# Patient Record
Sex: Male | Born: 1940 | Race: White | Hispanic: No | Smoking: Former smoker
Health system: Southern US, Community
[De-identification: ages and names within clinical notes are randomized; demographics above are authoritative.]

## PROBLEM LIST (undated history)

## (undated) DIAGNOSIS — I629 Nontraumatic intracranial hemorrhage, unspecified: Secondary | ICD-10-CM

## (undated) DIAGNOSIS — I639 Cerebral infarction, unspecified: Secondary | ICD-10-CM

## (undated) DIAGNOSIS — I509 Heart failure, unspecified: Secondary | ICD-10-CM

## (undated) DIAGNOSIS — Z86718 Personal history of other venous thrombosis and embolism: Secondary | ICD-10-CM

## (undated) DIAGNOSIS — I2699 Other pulmonary embolism without acute cor pulmonale: Secondary | ICD-10-CM

## (undated) HISTORY — PX: BACK SURGERY: SHX140

## (undated) HISTORY — PX: OTHER SURGICAL HISTORY: SHX169

## (undated) HISTORY — PX: KNEE SURGERY: SHX244

## (undated) HISTORY — PX: LOBECTOMY: SHX5089

---

## 2000-04-26 ENCOUNTER — Encounter: Payer: Self-pay | Admitting: Emergency Medicine

## 2000-04-26 ENCOUNTER — Emergency Department (HOSPITAL_COMMUNITY): Admission: EM | Admit: 2000-04-26 | Discharge: 2000-04-26 | Payer: Self-pay | Admitting: Emergency Medicine

## 2000-07-17 ENCOUNTER — Ambulatory Visit (HOSPITAL_COMMUNITY): Admission: RE | Admit: 2000-07-17 | Discharge: 2000-07-17 | Payer: Self-pay | Admitting: Family Medicine

## 2000-08-04 ENCOUNTER — Ambulatory Visit (HOSPITAL_COMMUNITY): Admission: RE | Admit: 2000-08-04 | Discharge: 2000-08-04 | Payer: Self-pay | Admitting: Family Medicine

## 2002-10-04 ENCOUNTER — Inpatient Hospital Stay (HOSPITAL_COMMUNITY): Admission: AD | Admit: 2002-10-04 | Discharge: 2002-10-23 | Payer: Self-pay | Admitting: Cardiology

## 2002-10-04 ENCOUNTER — Encounter: Payer: Self-pay | Admitting: Cardiology

## 2002-10-10 ENCOUNTER — Encounter: Payer: Self-pay | Admitting: Cardiology

## 2002-10-11 ENCOUNTER — Encounter: Payer: Self-pay | Admitting: Cardiology

## 2002-10-12 ENCOUNTER — Encounter: Payer: Self-pay | Admitting: Cardiology

## 2002-10-13 ENCOUNTER — Encounter: Payer: Self-pay | Admitting: Cardiology

## 2002-10-15 ENCOUNTER — Encounter: Payer: Self-pay | Admitting: Cardiology

## 2002-10-17 ENCOUNTER — Encounter: Payer: Self-pay | Admitting: Cardiology

## 2006-07-26 ENCOUNTER — Inpatient Hospital Stay (HOSPITAL_COMMUNITY): Admission: EM | Admit: 2006-07-26 | Discharge: 2006-07-29 | Payer: Self-pay | Admitting: Emergency Medicine

## 2013-11-17 ENCOUNTER — Ambulatory Visit
Admission: RE | Admit: 2013-11-17 | Discharge: 2013-11-17 | Disposition: A | Discharge status: Home / Self Care | Payer: Medicare Other | Source: Ambulatory Visit | Attending: Family Medicine | Admitting: Family Medicine

## 2013-11-17 ENCOUNTER — Other Ambulatory Visit: Payer: Self-pay | Admitting: Family Medicine

## 2013-11-17 DIAGNOSIS — M25561 Pain in right knee: Secondary | ICD-10-CM

## 2013-11-17 DIAGNOSIS — R609 Edema, unspecified: Secondary | ICD-10-CM

## 2013-11-23 ENCOUNTER — Emergency Department (HOSPITAL_COMMUNITY): Payer: Medicare Other

## 2013-11-23 ENCOUNTER — Other Ambulatory Visit: Payer: Self-pay | Admitting: Family Medicine

## 2013-11-23 ENCOUNTER — Inpatient Hospital Stay (HOSPITAL_COMMUNITY)
Admission: EM | Admit: 2013-11-23 | Discharge: 2013-11-30 | DRG: 175 | Disposition: A | Discharge status: Skilled Nursing Facility | Payer: Medicare Other | Attending: Internal Medicine | Admitting: Internal Medicine

## 2013-11-23 ENCOUNTER — Ambulatory Visit
Admission: RE | Admit: 2013-11-23 | Discharge: 2013-11-23 | Disposition: A | Discharge status: Home / Self Care | Payer: Medicare Other | Source: Ambulatory Visit | Attending: Family Medicine | Admitting: Family Medicine

## 2013-11-23 ENCOUNTER — Encounter (HOSPITAL_COMMUNITY): Payer: Self-pay | Admitting: Emergency Medicine

## 2013-11-23 DIAGNOSIS — I428 Other cardiomyopathies: Secondary | ICD-10-CM | POA: Diagnosis present

## 2013-11-23 DIAGNOSIS — N183 Chronic kidney disease, stage 3 unspecified: Secondary | ICD-10-CM | POA: Diagnosis present

## 2013-11-23 DIAGNOSIS — R609 Edema, unspecified: Secondary | ICD-10-CM

## 2013-11-23 DIAGNOSIS — R0602 Shortness of breath: Secondary | ICD-10-CM

## 2013-11-23 DIAGNOSIS — I4729 Other ventricular tachycardia: Secondary | ICD-10-CM | POA: Diagnosis not present

## 2013-11-23 DIAGNOSIS — Z87891 Personal history of nicotine dependence: Secondary | ICD-10-CM

## 2013-11-23 DIAGNOSIS — I472 Ventricular tachycardia, unspecified: Secondary | ICD-10-CM | POA: Diagnosis not present

## 2013-11-23 DIAGNOSIS — Z9181 History of falling: Secondary | ICD-10-CM

## 2013-11-23 DIAGNOSIS — E875 Hyperkalemia: Secondary | ICD-10-CM | POA: Diagnosis present

## 2013-11-23 DIAGNOSIS — R079 Chest pain, unspecified: Secondary | ICD-10-CM

## 2013-11-23 DIAGNOSIS — I2789 Other specified pulmonary heart diseases: Secondary | ICD-10-CM

## 2013-11-23 DIAGNOSIS — I4949 Other premature depolarization: Secondary | ICD-10-CM | POA: Diagnosis not present

## 2013-11-23 DIAGNOSIS — I5022 Chronic systolic (congestive) heart failure: Secondary | ICD-10-CM

## 2013-11-23 DIAGNOSIS — Z823 Family history of stroke: Secondary | ICD-10-CM

## 2013-11-23 DIAGNOSIS — I5042 Chronic combined systolic (congestive) and diastolic (congestive) heart failure: Secondary | ICD-10-CM | POA: Diagnosis present

## 2013-11-23 DIAGNOSIS — D7589 Other specified diseases of blood and blood-forming organs: Secondary | ICD-10-CM | POA: Diagnosis present

## 2013-11-23 DIAGNOSIS — E43 Unspecified severe protein-calorie malnutrition: Secondary | ICD-10-CM | POA: Diagnosis present

## 2013-11-23 DIAGNOSIS — Z515 Encounter for palliative care: Secondary | ICD-10-CM

## 2013-11-23 DIAGNOSIS — E162 Hypoglycemia, unspecified: Secondary | ICD-10-CM | POA: Diagnosis present

## 2013-11-23 DIAGNOSIS — I272 Pulmonary hypertension, unspecified: Secondary | ICD-10-CM | POA: Diagnosis present

## 2013-11-23 DIAGNOSIS — I959 Hypotension, unspecified: Secondary | ICD-10-CM | POA: Diagnosis present

## 2013-11-23 DIAGNOSIS — I2699 Other pulmonary embolism without acute cor pulmonale: Principal | ICD-10-CM | POA: Diagnosis present

## 2013-11-23 DIAGNOSIS — Z820 Family history of epilepsy and other diseases of the nervous system: Secondary | ICD-10-CM

## 2013-11-23 DIAGNOSIS — R05 Cough: Secondary | ICD-10-CM | POA: Diagnosis present

## 2013-11-23 DIAGNOSIS — K5900 Constipation, unspecified: Secondary | ICD-10-CM | POA: Diagnosis not present

## 2013-11-23 DIAGNOSIS — K7689 Other specified diseases of liver: Secondary | ICD-10-CM | POA: Diagnosis present

## 2013-11-23 DIAGNOSIS — Z86711 Personal history of pulmonary embolism: Secondary | ICD-10-CM

## 2013-11-23 DIAGNOSIS — R5381 Other malaise: Secondary | ICD-10-CM

## 2013-11-23 DIAGNOSIS — I129 Hypertensive chronic kidney disease with stage 1 through stage 4 chronic kidney disease, or unspecified chronic kidney disease: Secondary | ICD-10-CM | POA: Diagnosis present

## 2013-11-23 DIAGNOSIS — Z79899 Other long term (current) drug therapy: Secondary | ICD-10-CM

## 2013-11-23 DIAGNOSIS — R791 Abnormal coagulation profile: Secondary | ICD-10-CM | POA: Diagnosis present

## 2013-11-23 DIAGNOSIS — Z7982 Long term (current) use of aspirin: Secondary | ICD-10-CM

## 2013-11-23 DIAGNOSIS — I69998 Other sequelae following unspecified cerebrovascular disease: Secondary | ICD-10-CM

## 2013-11-23 DIAGNOSIS — D696 Thrombocytopenia, unspecified: Secondary | ICD-10-CM | POA: Diagnosis present

## 2013-11-23 DIAGNOSIS — K649 Unspecified hemorrhoids: Secondary | ICD-10-CM | POA: Diagnosis present

## 2013-11-23 DIAGNOSIS — I509 Heart failure, unspecified: Secondary | ICD-10-CM | POA: Diagnosis present

## 2013-11-23 DIAGNOSIS — R059 Cough, unspecified: Secondary | ICD-10-CM | POA: Diagnosis present

## 2013-11-23 DIAGNOSIS — R7989 Other specified abnormal findings of blood chemistry: Secondary | ICD-10-CM | POA: Diagnosis present

## 2013-11-23 DIAGNOSIS — I69959 Hemiplegia and hemiparesis following unspecified cerebrovascular disease affecting unspecified side: Secondary | ICD-10-CM

## 2013-11-23 HISTORY — DX: Other pulmonary embolism without acute cor pulmonale: I26.99

## 2013-11-23 HISTORY — DX: Heart failure, unspecified: I50.9

## 2013-11-23 HISTORY — DX: Nontraumatic intracranial hemorrhage, unspecified: I62.9

## 2013-11-23 LAB — COMPREHENSIVE METABOLIC PANEL
ALT: 69 U/L — ABNORMAL HIGH (ref 0–53)
AST: 66 U/L — ABNORMAL HIGH (ref 0–37)
Albumin: 3.4 g/dL — ABNORMAL LOW (ref 3.5–5.2)
CO2: 27 mEq/L (ref 19–32)
Chloride: 99 mEq/L (ref 96–112)
Glucose, Bld: 84 mg/dL (ref 70–99)
Potassium: 3.9 mEq/L (ref 3.5–5.1)
Total Protein: 5.5 g/dL — ABNORMAL LOW (ref 6.0–8.3)

## 2013-11-23 LAB — BLOOD GAS, ARTERIAL
Acid-Base Excess: 1.1 mmol/L (ref 0.0–2.0)
Drawn by: 252031
O2 Content: 2 L/min
TCO2: 24.8 mmol/L (ref 0–100)
pCO2 arterial: 30.1 mmHg — ABNORMAL LOW (ref 35.0–45.0)
pO2, Arterial: 132 mmHg — ABNORMAL HIGH (ref 80.0–100.0)

## 2013-11-23 LAB — CBC WITH DIFFERENTIAL/PLATELET
Basophils Absolute: 0 10*3/uL (ref 0.0–0.1)
Basophils Relative: 0 % (ref 0–1)
Eosinophils Relative: 0 % (ref 0–5)
HCT: 38.8 % — ABNORMAL LOW (ref 39.0–52.0)
MCH: 34.8 pg — ABNORMAL HIGH (ref 26.0–34.0)
MCV: 103.7 fL — ABNORMAL HIGH (ref 78.0–100.0)
Monocytes Absolute: 0.4 10*3/uL (ref 0.1–1.0)
Monocytes Relative: 9 % (ref 3–12)
Neutrophils Relative %: 63 % (ref 43–77)
Platelets: 99 10*3/uL — ABNORMAL LOW (ref 150–400)
RDW: 16.7 % — ABNORMAL HIGH (ref 11.5–15.5)
WBC: 4.8 10*3/uL (ref 4.0–10.5)

## 2013-11-23 LAB — TROPONIN I: Troponin I: <0.3 ng/mL (ref <0.30)

## 2013-11-23 LAB — MRSA PCR SCREENING: MRSA by PCR: NEGATIVE

## 2013-11-23 LAB — APTT: aPTT: 35 seconds (ref 24–37)

## 2013-11-23 LAB — MAGNESIUM: Magnesium: 2.1 mg/dL (ref 1.5–2.5)

## 2013-11-23 LAB — PROTIME-INR
INR: 1.58 — ABNORMAL HIGH (ref 0.00–1.49)
Prothrombin Time: 18.4 seconds — ABNORMAL HIGH (ref 11.6–15.2)

## 2013-11-23 LAB — PRO B NATRIURETIC PEPTIDE: Pro B Natriuretic peptide (BNP): 37244 pg/mL — ABNORMAL HIGH (ref 0–125)

## 2013-11-23 MED ORDER — DOCUSATE SODIUM 100 MG PO CAPS
100.0000 mg | ORAL_CAPSULE | Freq: Every day | ORAL | Status: DC
  Administered 2013-11-24 – 2013-11-26 (×3): 100 mg via ORAL
  Filled 2013-11-23 (×3): qty 1

## 2013-11-23 MED ORDER — PNEUMOCOCCAL VAC POLYVALENT 25 MCG/0.5ML IJ INJ
0.5000 mL | INJECTION | INTRAMUSCULAR | Status: AC
  Administered 2013-11-24: 0.5 mL via INTRAMUSCULAR
  Filled 2013-11-23: qty 0.5

## 2013-11-23 MED ORDER — IOHEXOL 350 MG/ML SOLN
125.0000 mL | Freq: Once | INTRAVENOUS | Status: AC | PRN
  Administered 2013-11-23: 125 mL via INTRAVENOUS

## 2013-11-23 MED ORDER — SODIUM CHLORIDE 0.9 % IV BOLUS (SEPSIS)
250.0000 mL | Freq: Once | INTRAVENOUS | Status: AC
  Administered 2013-11-23: 250 mL via INTRAVENOUS
  Administered 2013-11-23: 22:00:00 via INTRAVENOUS

## 2013-11-23 MED ORDER — HEPARIN (PORCINE) IN NACL 100-0.45 UNIT/ML-% IJ SOLN
1200.0000 [IU]/h | INTRAMUSCULAR | Status: DC
  Administered 2013-11-23 – 2013-11-24 (×2): 1300 [IU]/h via INTRAVENOUS
  Administered 2013-11-25 – 2013-11-26 (×3): 1200 [IU]/h via INTRAVENOUS
  Filled 2013-11-23 (×10): qty 250

## 2013-11-23 MED ORDER — HEPARIN BOLUS VIA INFUSION
4000.0000 [IU] | Freq: Once | INTRAVENOUS | Status: AC
  Administered 2013-11-23: 4000 [IU] via INTRAVENOUS
  Filled 2013-11-23: qty 4000

## 2013-11-23 MED ORDER — INFLUENZA VAC SPLIT QUAD 0.5 ML IM SUSP
0.5000 mL | INTRAMUSCULAR | Status: AC
  Administered 2013-11-24: 0.5 mL via INTRAMUSCULAR
  Filled 2013-11-23: qty 0.5

## 2013-11-23 MED ORDER — SODIUM CHLORIDE 0.9 % IV SOLN
INTRAVENOUS | Status: DC
  Administered 2013-11-23: 20:00:00 via INTRAVENOUS

## 2013-11-23 MED ORDER — SODIUM CHLORIDE 0.9 % IV BOLUS (SEPSIS)
500.0000 mL | Freq: Once | INTRAVENOUS | Status: AC
  Administered 2013-11-23: 500 mL via INTRAVENOUS
  Administered 2013-11-23: 23:00:00 via INTRAVENOUS

## 2013-11-23 MED ORDER — SODIUM CHLORIDE 0.9 % IV BOLUS (SEPSIS)
250.0000 mL | INTRAVENOUS | Status: AC
  Administered 2013-11-23: 250 mL via INTRAVENOUS

## 2013-11-23 MED ORDER — ALBUTEROL SULFATE HFA 108 (90 BASE) MCG/ACT IN AERS
1.0000 | INHALATION_SPRAY | Freq: Four times a day (QID) | RESPIRATORY_TRACT | Status: DC | PRN
  Filled 2013-11-23: qty 6.7

## 2013-11-23 NOTE — Progress Notes (Signed)
Heparin level reports as >2 though per RN there were two phlebotomists drawing labs and they drew blood from both arms so unclear whether this lab is from site where heparin is running.  Will get stat level now from opposite arm  Vernard Gambles, PharmD, BCPS 11/23/2013 11:16 PM

## 2013-11-23 NOTE — Progress Notes (Signed)
eLink Physician-Brief Progress Note Patient Name: Chase [name] DOB: May 03, 1941 MRN: [id number]  Date of Service  11/23/2013   HPI/Events of Note   Submassive PE Hypotension History of Intracranial hemorrhage  eICU Interventions  Bolus NS 500cc now Re-assess afterwards   Intervention Category Intermediate Interventions: Hypotension - evaluation and management  MCQUAID, DOUGLAS 11/23/2013, 10:31 PM

## 2013-11-23 NOTE — ED Provider Notes (Signed)
CSN: [id number]     Arrival date & time 11/23/13  1442 History   First MD Initiated Contact with Patient 11/23/13 1455     Chief Complaint  Patient presents with  . Shortness of Breath   (Consider location/radiation/quality/duration/timing/severity/associated sxs/prior Treatment) Patient is a 72 y.o. male presenting with shortness of breath. The history is provided by the patient.  Shortness of Breath Severity:  Mild Onset quality:  Gradual Duration:  3 weeks Timing:  Constant Progression:  Worsening Chronicity:  New Context: activity   Relieved by:  Nothing Worsened by:  Nothing tried Ineffective treatments:  None tried Associated symptoms: no abdominal pain, no chest pain, no cough, no fever, no headaches, no neck pain and no vomiting   Risk factors: hx of PE/DVT     Past Medical History  Diagnosis Date  . CHF (congestive heart failure)    Past Surgical History  Procedure Laterality Date  . Lobectomy     History reviewed. No pertinent family history. History  Substance Use Topics  . Smoking status: Never Smoker   . Smokeless tobacco: Not on file  . Alcohol Use: No    Review of Systems  Constitutional: Negative for fever.  HENT: Negative for drooling and rhinorrhea.   Eyes: Negative for pain.  Respiratory: Positive for shortness of breath. Negative for cough.   Cardiovascular: Negative for chest pain and leg swelling.  Gastrointestinal: Negative for nausea, vomiting, abdominal pain and diarrhea.  Genitourinary: Negative for dysuria and hematuria.  Musculoskeletal: Negative for gait problem and neck pain.  Skin: Negative for color change.  Neurological: Negative for numbness and headaches.  Hematological: Negative for adenopathy.  Psychiatric/Behavioral: Negative for behavioral problems.  All other systems reviewed and are negative.    Allergies  Review of patient's allergies indicates no known allergies.  Home Medications  No current outpatient  prescriptions on file. BP 92/57  Pulse 101  Temp(Src) 97.4 F (36.3 C) (Oral)  Resp 20  SpO2 92% Physical Exam  Nursing note and vitals reviewed. Constitutional: He is oriented to person, place, and time. He appears well-developed and well-nourished.  HENT:  Head: Normocephalic and atraumatic.  Right Ear: External ear normal.  Left Ear: External ear normal.  Nose: Nose normal.  Mouth/Throat: Oropharynx is clear and moist. No oropharyngeal exudate.  Eyes: Conjunctivae and EOM are normal. Pupils are equal, round, and reactive to light.  Neck: Normal range of motion. Neck supple.  Cardiovascular: Normal rate, regular rhythm, normal heart sounds and intact distal pulses.  Exam reveals no gallop and no friction rub.   No murmur heard. Pulmonary/Chest: Effort normal and breath sounds normal. No respiratory distress. He has no wheezes.  Abdominal: Soft. Bowel sounds are normal. He exhibits no distension. There is no tenderness. There is no rebound and no guarding.  Musculoskeletal: Normal range of motion. He exhibits edema (Moderate pitting edema in bilateral LE's extending to knees bilaterally. ). He exhibits no tenderness.  Neurological: He is alert and oriented to person, place, and time.  Skin: Skin is warm and dry.  Psychiatric: He has a normal mood and affect. His behavior is normal.    ED Course  Procedures (including critical care time) Labs Review Labs Reviewed  CBC WITH DIFFERENTIAL  COMPREHENSIVE METABOLIC PANEL  TROPONIN I  PRO B NATRIURETIC PEPTIDE  PROTIME-INR   Imaging Review Ct Angio Chest Pe W/cm &/or Wo Cm  11/23/2013   CLINICAL DATA:  History of PE, right-sided chest pain and shortness of breath for 3.5  weeks  EXAM: CT ANGIOGRAPHY CHEST WITH CONTRAST  TECHNIQUE: Multidetector CT imaging of the chest was performed using the standard protocol during bolus administration of intravenous contrast. Multiplanar CT image reconstructions including MIPs were obtained to  evaluate the vascular anatomy.  CONTRAST:  OMNIPAQUE IOHEXOL 350 MG/ML SOLN  COMPARISON:  Chest x-ray 11/17/2013  FINDINGS: Mediastinum: Unremarkable CT appearance of the thyroid gland. No suspicious mediastinal or hilar adenopathy. No soft tissue mediastinal mass. The thoracic esophagus is unremarkable.  Heart/Vascular: Adequate opacification of the pulmonary arteries to the proximal subsegmental level. Large volume of thrombus noted in the right main pulmonary artery extending in to the right lower lobar pulmonary artery and its segmental branches. At least 1 small segmental PE is noted in the inferior lingular artery. The main pulmonary artery is dilated with a transverse diameter of 4.4 cm. There is evidence of right heart strain. The RV/LV ratio is 0.92. Additionally, there is reflux of contrast material into the suprahepatic IVC and right hepatic veins.  Overall, there is cardiomegaly with both the left and right heart enlargement. Atherosclerotic calcifications are noted throughout the coronary arteries. No pericardial effusion.  Lungs/Pleura: Small-moderate bilateral layering pleural effusions with associated subsegmental atelectasis. The lungs are hyperinflated. No focal airspace consolidation or infiltrate. No suspicious pulmonary nodule or mass.  Bones/Soft Tissues: No acute fracture or aggressive appearing lytic or blastic osseous lesion. Multilevel degenerative disc disease.  Upper Abdomen: Incompletely imaged sub cm low-attenuation lesions in the right and left liver are incompletely characterized but low in attenuation and a most consistent with small hepatic cysts. Additionally, there is a incompletely imaged 2.2 cm low-attenuation lesion in the upper pole left kidney which is also statistically likely a simple cyst.  Review of the MIP images confirms the above findings.  IMPRESSION: 1. Large volume pulmonary embolus in the right main pulmonary artery extending into the right lower lobar  pulmonary artery and its segmental branches with an additional small segmental PE in the inferior lingula with evidence of right heart strain (RV/LV ratio = 0.92) consistent with intermediate risk/sub massive PE. No airspace infiltrate or evidence of pulmonary infarction. Given the appearance of slight peripheralization of thrombus within the pulmonary arterial lumens, this may reflect a subacute PE. Recommend clinical correlation with the duration of the patient's underlying symptoms. 2. The main pulmonary artery is markedly enlarged suggesting underlying pulmonary arterial hypertension. 3. Cardiomegaly with both right and left sided enlargement. 4. Atherosclerosis including coronary artery disease. 5. Moderate bilateral layering pleural effusions with associated mild subsegmental atelectasis. 6. Incompletely evaluated low-attenuation lesions in the liver and left kidney are statistically highly likely benign cysts.  Critical Value/emergent results were called by telephone at the time of interpretation on 11/23/2013 at 2:32 PM to [facility] , who verbally acknowledged these results. The patient will be sent from the outpatient imaging center to the [facility] emergency room.  Signed,  Sterling Big, MD  [facility]  [facility]   Electronically Signed   By: Malachy Moan M.D.   On: 11/23/2013 14:50    EKG Interpretation    Date/Time:  Wednesday November 23 2013 15:07:35 EST Ventricular Rate:  87 PR Interval:  207 QRS Duration: 179 QT Interval:  492 QTC Calculation: 592 R Axis:   -102 Text Interpretation:  Sinus rhythm Ventricular tachycardia, unsustained Right bundle branch block Inverted t waves in V1-V3 Confirmed by Aleni Andrus  MD, Mavi Un ([id number]) on 11/23/2013 3:28:29 PM  CRITICAL CARE Performed by: Purvis Sheffield, S Total critical care time: 40 min Critical care time was exclusive of separately billable procedures  and treating other patients. Critical care was necessary to treat or prevent imminent or life-threatening deterioration. Critical care was time spent personally by me on the following activities: development of treatment plan with patient and/or surrogate as well as nursing, discussions with consultants, evaluation of patient's response to treatment, examination of patient, obtaining history from patient or surrogate, ordering and performing treatments and interventions, ordering and review of laboratory studies, ordering and review of radiographic studies, pulse oximetry and re-evaluation of patient's condition.  MDM   1. Pulmonary embolism   2. CHF (congestive heart failure)   3. Pulmonary HTN   4. Pulmonary embolus   5. Elevated serum creatinine    3:18 PM 72 y.o. male w hx of PE, CHF pw sob x 3 weeks. He notes occasional right shoulder pain over the last several days as well. He had an outpatient CTA today which showed a pulmonary embolism, likely subacute. He is mildly tachycardic with a heart rate of 101 and an initial blood pressure of 92/57. He is mentating well and interactive on exam. He does have a history of an intracerebral hematoma in 2003 but notes that he was on Coumadin several years ago for a previous pulmonary embolism. Will get labs, 250 cc IV fluid bolus, and consult pharmacy for heparinization. Will continue to monitor closely.  Critical care was documented in this patient w/ a submassive PE and right heart strain. He required heparin as well as close monitoring d/t his borderline BP's. The case was discussed w/ critical care and the pt will be admitted to a step down bed on the medicine service.     Junius Argyle, MD 11/24/13 [phone]

## 2013-11-23 NOTE — ED Notes (Signed)
Pt here from PCP with multiple PE; pt SOB x 2 weeks; pt noted to appear cyanotic in lips at present and sts some CP

## 2013-11-23 NOTE — ED Notes (Signed)
MD at bedside. 

## 2013-11-23 NOTE — ED Notes (Signed)
MD informed pt having runs of Vtach

## 2013-11-23 NOTE — Consult Note (Signed)
PULMONARY/CCM NOTE  Requesting MD/Service: IMTS Date of admission: 11/26 Date of consult: 11/26 Reason for consultation:  PE  Pt Profile:  45 M with hx of PE 72yrs ago after LE trauma admitted with 3 wks of increasing LE edema and DOE. CTA chest revealed large PE. PCCM asked to see due to mild hypotension     HPI:  As above. He has gotten to the point that he is unsteady on his feet and consequently saw his primary MD who ordered a CTA chest revealing PE. He was sent to the [facility] ED where he was admitted by IMTS. He has had asymptomatic mild hypotension since admission. Reports bilateral LE edema and pain. Denies CP  Past Medical History  Diagnosis Date  . CHF (congestive heart failure)   . Unspecified intracranial hemorrhage   . Pulmonary embolism     history on Coumadin    MEDICATIONS: reviewed  History   Social History  . Marital Status: Divorced    Spouse Name: N/A    Number of Children: N/A  . Years of Education: N/A   Occupational History  . Not on file.   Social History Main Topics  . Smoking status: Never Smoker   . Smokeless tobacco: Not on file  . Alcohol Use: No  . Drug Use: No  . Sexual Activity: Not on file   Other Topics Concern  . Not on file   Social History Narrative  . No narrative on file    History reviewed. No pertinent family history.  ROS - As per HPI. Otherwise N/C  Filed Vitals:   11/23/13 2030 11/23/13 2045 11/23/13 2100 11/23/13 2123  BP: 96/71 82/39 88/54  90/68  Pulse: 30 62 62 61  Temp:      TempSrc:      Resp: 17 15 11 12   Height:      Weight:      SpO2: 100% 100% 100% 100%    EXAM:  Gen: pleasant, NAD HEENT: WNL Neck: + JVD Lungs: clear anteriorly without wheezes Cardiovascular: irregular, no M noted Abdomen: soft, NT, NABS Ext: 3+ BLE edema slightly worse on L than R, early chronic stasis changes Neuro: intact  DATA:  I have reviewed all of today's lab results. Relevant abnormalities are discussed in the A/P  section  EKG: RBBB, ventricular ectopy  CXR: CM, interstitial prominence, minimal blunting of R CP angle  CT chest: CM, large PAs, large PE in R main artery, small B effusions   IMPRESSION:   Submassive pulmonary embolus - recurrent  Not a presently candidate for thrombolytics due to prior CNS bleed Pulmonary HTN Severe BLE edema H/O CHF (congestive heart failure) Mild thrombocytopenia - likely consumptive Macrocytosis  PLAN:  Agree with heparin Agree with Echo and LE Dopplers If significant residual clot in LEs, would have IVC filter placed Will need lifelong anticoagulation    Billy Fischer, MD ; [facility] service Mobile [phone].  After 5:30 PM or weekends, call [phone]
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
## 2013-11-23 NOTE — ED Notes (Signed)
Attempt to call report.

## 2013-11-23 NOTE — ED Notes (Signed)
X-ray at bedside

## 2013-11-23 NOTE — Progress Notes (Signed)
ANTICOAGULATION CONSULT NOTE - Initial Consult  Pharmacy Consult for heparin Indication: pulmonary embolus  No Known Allergies  Patient Measurements:   Heparin Dosing Weight: 80kg  Vital Signs: Temp: 97.4 F (36.3 C) (11/26 1446) Temp src: Oral (11/26 1446) BP: 92/57 mmHg (11/26 1446) Pulse Rate: 101 (11/26 1446)  Labs: No results found for this basename: HGB, HCT, PLT, APTT, LABPROT, INR, HEPARINUNFRC, CREATININE, CKTOTAL, CKMB, TROPONINI,  in the last 72 hours  CrCl is unknown because no creatinine reading has been taken and the patient has no height on file.   Medical History: Past Medical History  Diagnosis Date  . CHF (congestive heart failure)     Medications:   (Not in a hospital admission) Scheduled:    Assessment: 72 yo who was admitted for new PE. He is not on anticoagulant PTA. He said that he had previous PE 4-5 yrs ago? IV heparin has been order for anticoagulant here now   Goal of Therapy:  Heparin level 0.3-0.7 units/ml Monitor platelets by anticoagulation protocol: Yes   Plan:  Heparin bolus 4000 units x1 Heparin drip at 1300 units/hr Check baseline coags Heparin level in 6 hrs Daily level and CBC  Ulyses Southward, PharmD Pager: [phone] 11/23/2013 3:31 PM

## 2013-11-24 ENCOUNTER — Encounter (HOSPITAL_COMMUNITY): Payer: Self-pay | Admitting: Internal Medicine

## 2013-11-24 DIAGNOSIS — D696 Thrombocytopenia, unspecified: Secondary | ICD-10-CM

## 2013-11-24 DIAGNOSIS — I059 Rheumatic mitral valve disease, unspecified: Secondary | ICD-10-CM

## 2013-11-24 DIAGNOSIS — I2699 Other pulmonary embolism without acute cor pulmonale: Secondary | ICD-10-CM

## 2013-11-24 LAB — COMPREHENSIVE METABOLIC PANEL
ALT: 71 U/L — ABNORMAL HIGH (ref 0–53)
AST: 68 U/L — ABNORMAL HIGH (ref 0–37)
Albumin: 3.1 g/dL — ABNORMAL LOW (ref 3.5–5.2)
Calcium: 8.8 mg/dL (ref 8.4–10.5)
Chloride: 103 mEq/L (ref 96–112)
Creatinine, Ser: 1.34 mg/dL (ref 0.50–1.35)
Sodium: 142 mEq/L (ref 135–145)
Total Bilirubin: 1.8 mg/dL — ABNORMAL HIGH (ref 0.3–1.2)
Total Protein: 5 g/dL — ABNORMAL LOW (ref 6.0–8.3)

## 2013-11-24 LAB — HEPARIN LEVEL (UNFRACTIONATED)
Heparin Unfractionated: 0.37 IU/mL (ref 0.30–0.70)
Heparin Unfractionated: 0.51 IU/mL (ref 0.30–0.70)
Heparin Unfractionated: 0.78 IU/mL — ABNORMAL HIGH (ref 0.30–0.70)
Heparin Unfractionated: 0.5 IU/mL (ref 0.30–0.70)

## 2013-11-24 LAB — TROPONIN I: Troponin I: <0.3 ng/mL (ref <0.30)

## 2013-11-24 LAB — CBC
MCH: 34.6 pg — ABNORMAL HIGH (ref 26.0–34.0)
Platelets: 81 10*3/uL — ABNORMAL LOW (ref 150–400)
RBC: 3.56 MIL/uL — ABNORMAL LOW (ref 4.22–5.81)
RDW: 16.7 % — ABNORMAL HIGH (ref 11.5–15.5)
WBC: 4.9 10*3/uL (ref 4.0–10.5)

## 2013-11-24 LAB — PROTIME-INR: Prothrombin Time: 19.7 seconds — ABNORMAL HIGH (ref 11.6–15.2)

## 2013-11-24 NOTE — Progress Notes (Signed)
ANTICOAGULATION CONSULT NOTE - Follow Up Consult  Pharmacy Consult for Heparin  Indication: pulmonary embolus  No Known Allergies  Patient Measurements: Height: 6\' 3"  (190.5 cm) Weight: 173 lb 8 oz (78.699 kg) IBW/kg (Calculated) : 84.5  Vital Signs: Temp: 97.9 F (36.6 C) (11/27 1600) Temp src: Oral (11/27 1600) BP: 101/67 mmHg (11/27 1800) Pulse Rate: 80 (11/27 1600)  Labs:  Recent Labs  11/23/13 0100 11/23/13 1514 11/23/13 1532 11/23/13 2140 11/24/13 0427 11/24/13 0845 11/24/13 1936  HGB  --  13.0  --   --  12.3*  --   --   HCT  --  38.8*  --   --  37.8*  --   --   PLT  --  99*  --   --  81*  --   --   APTT  --   --  35  --   --   --   --   LABPROT  --   --  18.4*  --  19.7*  --   --   INR  --   --  1.58*  --  1.72*  --   --   HEPARINUNFRC 0.37  --   --  >2.20* 0.51 0.78* 0.50  CREATININE  --  1.39*  --   --  1.34  --   --   TROPONINI  --  <0.30  --  <0.30 <0.30  --   --     Estimated Creatinine Clearance: 56.3 ml/min (by C-G formula based on Cr of 1.34).   Medications:  Heparin 1200 units/hr  Assessment: 72  Y/o M on heparin for PE. Heparin level 0.5. No bleeding noted.  Goal of Therapy:  Heparin level 0.3-0.7 units/ml Monitor platelets by anticoagulation protocol: Yes   Plan:  -Continue heparin at current rate, recheck with am labs to assure therapeutic. -Daily CBC/HL -Monitor for bleeding   Thank you for allowing pharmacy to be a part of this patients care team.  Lovenia Kim Pharm.D., BCPS Clinical Pharmacist 11/24/2013 8:19 PM Pager: [phone] Phone: [phone]
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
## 2013-11-24 NOTE — Progress Notes (Signed)
Subjective: Chase [name] is doing better this morning.  States that he breathing is improved as long as he is at rest.   Objective: Vital signs in last 24 hours: Filed Vitals:   11/24/13 0402 11/24/13 0500 11/24/13 0600 11/24/13 0800  BP: 95/78 101/62 93/68 94/70   Pulse: 67 76 56 69  Temp: 97.6 F (36.4 C)   99 F (37.2 C)  TempSrc: Oral   Axillary  Resp: 22 20 15 15   Height:      Weight:  173 lb 8 oz (78.699 kg)    SpO2: 100% 100% 94% 100%   Weight change:   Intake/Output Summary (Last 24 hours) at 11/24/13 0901 Last data filed at 11/24/13 0809  Gross per 24 hour  Intake   1471 ml  Output    325 ml  Net   1146 ml   PEX General: alert, cooperative, and in no apparent distress HEENT: NCAT Neck: supple, no lymphadenopathy, mild JVD Lungs: decreased breath sounds throughout but clear to ascultation bilaterally, normal work of respiration, no wheezes, rales, ronchi Heart: regular rate and rhythm, no murmurs, gallops, or rubs Abdomen: soft, non-tender, non-distended, normal bowel sounds Extremities: BLE 2+ edema L>R;  2+ DP/PT pulses bilaterally, no cyanosis, clubbing Neurologic: alert & oriented X3, cranial nerves II-XII intact, strength grossly intact, sensation intact to light touch  Lab Results: Basic Metabolic Panel:  Recent Labs Lab 11/23/13 1514 11/23/13 2140 11/24/13 0427  NA 141  --  142  K 3.9  --  4.5  CL 99  --  103  CO2 27  --  24  GLUCOSE 84  --  109*  BUN 45*  --  45*  CREATININE 1.39*  --  1.34  CALCIUM 9.2  --  8.8  MG  --  2.1  --   PHOS  --  4.0  --    Liver Function Tests:  Recent Labs Lab 11/23/13 1514 11/24/13 0427  AST 66* 68*  ALT 69* 71*  ALKPHOS 101 108  BILITOT 2.0* 1.8*  PROT 5.5* 5.0*  ALBUMIN 3.4* 3.1*   CBC:  Recent Labs Lab 11/23/13 1514 11/24/13 0427  WBC 4.8 4.9  NEUTROABS 3.0  --   HGB 13.0 12.3*  HCT 38.8* 37.8*  MCV 103.7* 106.2*  PLT 99* 81*   Cardiac Enzymes:  Recent Labs Lab 11/23/13 1514  11/23/13 2140 11/24/13 0427  TROPONINI <0.30 <0.30 <0.30   BNP:  Recent Labs Lab 11/23/13 1514  PROBNP 37244.0*   Coagulation:  Recent Labs Lab 11/23/13 1532 11/24/13 0427  LABPROT 18.4* 19.7*  INR 1.58* 1.72*    Micro Results: Recent Results (from the past 240 hour(s))  MRSA PCR SCREENING     Status: None   Collection Time    11/23/13  7:16 PM      Result Value Range Status   MRSA by PCR NEGATIVE  NEGATIVE Final   Comment:            The GeneXpert MRSA Assay (FDA     approved for NASAL specimens     only), is one component of a     comprehensive MRSA colonization     surveillance program. It is not     intended to diagnose MRSA     infection nor to guide or     monitor treatment for     MRSA infections.   Studies/Results: Ct Angio Chest Pe W/cm &/or Wo Cm  11/23/2013   CLINICAL DATA:  History of  PE, right-sided chest pain and shortness of breath for 3.5 weeks  EXAM: CT ANGIOGRAPHY CHEST WITH CONTRAST  TECHNIQUE: Multidetector CT imaging of the chest was performed using the standard protocol during bolus administration of intravenous contrast. Multiplanar CT image reconstructions including MIPs were obtained to evaluate the vascular anatomy.  CONTRAST:  OMNIPAQUE IOHEXOL 350 MG/ML SOLN  COMPARISON:  Chest x-ray 11/17/2013  FINDINGS: Mediastinum: Unremarkable CT appearance of the thyroid gland. No suspicious mediastinal or hilar adenopathy. No soft tissue mediastinal mass. The thoracic esophagus is unremarkable.  Heart/Vascular: Adequate opacification of the pulmonary arteries to the proximal subsegmental level. Large volume of thrombus noted in the right main pulmonary artery extending in to the right lower lobar pulmonary artery and its segmental branches. At least 1 small segmental PE is noted in the inferior lingular artery. The main pulmonary artery is dilated with a transverse diameter of 4.4 cm. There is evidence of right heart strain. The RV/LV ratio is 0.92.  Additionally, there is reflux of contrast material into the suprahepatic IVC and right hepatic veins.  Overall, there is cardiomegaly with both the left and right heart enlargement. Atherosclerotic calcifications are noted throughout the coronary arteries. No pericardial effusion.  Lungs/Pleura: Small-moderate bilateral layering pleural effusions with associated subsegmental atelectasis. The lungs are hyperinflated. No focal airspace consolidation or infiltrate. No suspicious pulmonary nodule or mass.  Bones/Soft Tissues: No acute fracture or aggressive appearing lytic or blastic osseous lesion. Multilevel degenerative disc disease.  Upper Abdomen: Incompletely imaged sub cm low-attenuation lesions in the right and left liver are incompletely characterized but low in attenuation and a most consistent with small hepatic cysts. Additionally, there is a incompletely imaged 2.2 cm low-attenuation lesion in the upper pole left kidney which is also statistically likely a simple cyst.  Review of the MIP images confirms the above findings.  IMPRESSION: 1. Large volume pulmonary embolus in the right main pulmonary artery extending into the right lower lobar pulmonary artery and its segmental branches with an additional small segmental PE in the inferior lingula with evidence of right heart strain (RV/LV ratio = 0.92) consistent with intermediate risk/sub massive PE. No airspace infiltrate or evidence of pulmonary infarction. Given the appearance of slight peripheralization of thrombus within the pulmonary arterial lumens, this may reflect a subacute PE. Recommend clinical correlation with the duration of the patient's underlying symptoms. 2. The main pulmonary artery is markedly enlarged suggesting underlying pulmonary arterial hypertension. 3. Cardiomegaly with both right and left sided enlargement. 4. Atherosclerosis including coronary artery disease. 5. Moderate bilateral layering pleural effusions with associated mild  subsegmental atelectasis. 6. Incompletely evaluated low-attenuation lesions in the liver and left kidney are statistically highly likely benign cysts.  Critical Value/emergent results were called by telephone at the time of interpretation on 11/23/2013 at 2:32 PM to [facility] , who verbally acknowledged these results. The patient will be sent from the outpatient imaging center to the [facility] emergency room.  Signed,  Sterling Big, MD  [facility]  [facility]   Electronically Signed   By: Malachy Moan M.D.   On: 11/23/2013 14:50   Dg Chest Port 1 View  11/23/2013   CLINICAL DATA:  Shortness of breath.  EXAM: PORTABLE CHEST - 1 VIEW  COMPARISON:  10/2013 chest radiographs  FINDINGS: Moderate enlargement of the cardiac silhouette is similar to the prior exam. The lungs remain hyperinflated with slightly increased patchy and streaky opacities in the lung bases. Small right pleural effusion  remains. The left costophrenic angle is excluded from the image. There is no evidence of pulmonary edema or pneumothorax. No acute osseous abnormality is identified.  IMPRESSION: 1. Slightly increased bibasilar lung opacities, which could reflect subsegmental atelectasis. Aspiration or developing infection are other considerations. 2. Cardiomegaly without overt edema. 3. Persistent small right pleural effusion. Known left pleural effusion is not well seen.   Electronically Signed   By: Sebastian Ache   On: 11/23/2013 15:24   Medications: I have reviewed the patient's current medications. Scheduled Meds: . docusate sodium  100 mg Oral Daily  . influenza vac split quadrivalent PF  0.5 mL Intramuscular Tomorrow-1000  . pneumococcal 23 valent vaccine  0.5 mL Intramuscular Tomorrow-1000   Continuous Infusions: . sodium chloride 10 mL/hr at 11/23/13 2305  . heparin 1,300 Units/hr (11/24/13 0809)   PRN Meds:.albuterol Assessment/Plan: #Submassive Pulmonary  Embolism (with evidence of right heart strain)- Patient has a history of provoked PE 4-5 years ago (after lower extremity trauma), was on Coumadin for 6 months at that time but no anticoagulation since.  He reports progressive SOB and leg swelling L>R for the past several weeks and was evaluated by his PCP yesterday for this complaint; had CTA outpatient which showed submassive PE.  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  Troponins x 3 negative. H&H stable. Patient admits to weight loss and decreased appetite which is concerning for malignancy especially in setting of large PE, need to see if he has had age-appropriate malignancy screening including colonoscopy. - hem onc consult given reported history of intracerebral bleed and falls risk but need for lifelong anticoagulation - continue Heparin gtt for now - LE dopplers pending, if extensive clot burden will need IVC filter placed  - continue O2 therapy with goal pulse ox >92% - monitor BP given hypotension, concern for volume overload in setting of CHF - obtain records from [place]  #Pulmonary HTN with history of CHF-markedly enlarged main pulmonary artery on CT. No echocardiogram on file, will need to get records from [place].  Presentation likely multifactorial given PE and likely CHF.  He does have LE edema and elevated proBNP with moderate bilateral pleural effusions on imaging.  - Echo pending - again, will need to closely monitor for volume overload given fluids to maintain BP   #CKD3?- Cr 1.39 on admission, 1.34 today (baseline unknown). He does report a history of kidney disease. Need PCP records.  - Continue to monitor    Dispo: Disposition is deferred at this time, awaiting improvement of current medical problems.  Anticipated discharge in approximately 2-3 day(s).   The [facility] (No primary provider on file.) and does need an [facility] hospital follow-up appointment  after discharge.  .Services Needed at time of discharge: Y = Yes, Blank = No PT:   OT:   RN:   Equipment:   Other:     LOS: 1 day   Rocco Serene, MD 11/24/2013, 9:01 AM

## 2013-11-24 NOTE — Progress Notes (Signed)
Bilateral lower extremity venous duplex:  No evidence of DVT, superficial thrombosis, or Baker's Cyst.   

## 2013-11-24 NOTE — Progress Notes (Signed)
ANTICOAGULATION CONSULT NOTE - Follow Up Consult  Pharmacy Consult for Heparin  Indication: pulmonary embolus  No Known Allergies  Patient Measurements: Height: 6\' 3"  (190.5 cm) Weight: 169 lb 5 oz (76.8 kg) IBW/kg (Calculated) : 84.5  Vital Signs: Temp: 97.6 F (36.4 C) (11/27 0000) Temp src: Oral (11/27 0000) BP: 99/64 mmHg (11/27 0000) Pulse Rate: 62 (11/27 0000)  Labs:  Recent Labs  11/23/13 0100 11/23/13 1514 11/23/13 1532 11/23/13 2140  HGB  --  13.0  --   --   HCT  --  38.8*  --   --   PLT  --  99*  --   --   APTT  --   --  35  --   LABPROT  --   --  18.4*  --   INR  --   --  1.58*  --   HEPARINUNFRC 0.37  --   --  >2.20*  CREATININE  --  1.39*  --   --   TROPONINI  --  <0.30  --  <0.30    Estimated Creatinine Clearance: 52.9 ml/min (by C-G formula based on Cr of 1.39).   Medications:  Heparin 1300 units/hr  Assessment: 72  Y/o M on heparin for PE. First HL was >2.20 (REDRAW was 0.37). Other labs as above.   Goal of Therapy:  Heparin level 0.3-0.7 units/ml Monitor platelets by anticoagulation protocol: Yes   Plan:  -Continue heparin at 1300 units/hr -8 hour HL at 0900 -Daily CBC/HL -Monitor for bleeding  Thank you for allowing me to take part in this patient's care,  Abran Duke, PharmD Clinical Pharmacist Phone: [phone] Pager: [phone] 11/24/2013 1:35 AM
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
## 2013-11-24 NOTE — Progress Notes (Signed)
  Echocardiogram 2D Echocardiogram has been performed.  Chase [name] 11/24/2013, 9:06 AM

## 2013-11-24 NOTE — Progress Notes (Signed)
Updated his sister per patient request  Shirlee Latch MD
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
## 2013-11-24 NOTE — Progress Notes (Addendum)
ANTICOAGULATION CONSULT NOTE - Follow Up Consult  Pharmacy Consult for Heparin  Indication: pulmonary embolus  No Known Allergies  Patient Measurements: Height: 6\' 3"  (190.5 cm) Weight: 173 lb 8 oz (78.699 kg) IBW/kg (Calculated) : 84.5  Vital Signs: Temp: 99 F (37.2 C) (11/27 0800) Temp src: Axillary (11/27 0800) BP: 94/70 mmHg (11/27 0800) Pulse Rate: 69 (11/27 0800)  Labs:  Recent Labs  11/23/13 0100 11/23/13 1514 11/23/13 1532 11/23/13 2140 11/24/13 0427 11/24/13 0845  HGB  --  13.0  --   --  12.3*  --   HCT  --  38.8*  --   --  37.8*  --   PLT  --  99*  --   --  81*  --   APTT  --   --  35  --   --   --   LABPROT  --   --  18.4*  --  19.7*  --   INR  --   --  1.58*  --  1.72*  --   HEPARINUNFRC 0.37  --   --  >2.20* 0.51 0.78*  CREATININE  --  1.39*  --   --  1.34  --   TROPONINI  --  <0.30  --  <0.30 <0.30  --     Estimated Creatinine Clearance: 56.3 ml/min (by C-G formula based on Cr of 1.34).   Medications:  Heparin 1300 units/hr  Assessment: 72  Y/o M on heparin for PE. Heparin level 0.78 this AM which is above goal. Goal of Therapy:  Heparin level 0.3-0.7 units/ml Monitor platelets by anticoagulation protocol: Yes   Plan:  -Reduce heparin to 1200 units/hr -8 hour HL at 1800 -Daily CBC/HL -Monitor for bleeding  Thank you for allowing me to take part in this patient's care,  Celedonio Miyamoto, PharmD, BCPS Clinical Pharmacist Pager [phone]   11/24/2013 9:42 AM  Addendum - Asked by Dr. Shirlee Latch to verify home meds.  I asked the patient about his medications.  He said that he does have some furosemide at home as well as a cholesterol medication, but he is not actively taking them.  I confirmed the current list with Mr. Aldea. Celedonio Miyamoto, PharmD

## 2013-11-24 NOTE — Progress Notes (Signed)
Md notified of low urine output and low BP. Bladder scan performed and 30ml noted in the bladder. BP now 101/68.  Maymie Brunke, Charlaine Dalton RN

## 2013-11-25 DIAGNOSIS — E43 Unspecified severe protein-calorie malnutrition: Secondary | ICD-10-CM | POA: Diagnosis present

## 2013-11-25 DIAGNOSIS — I5022 Chronic systolic (congestive) heart failure: Secondary | ICD-10-CM | POA: Diagnosis present

## 2013-11-25 DIAGNOSIS — D696 Thrombocytopenia, unspecified: Secondary | ICD-10-CM | POA: Diagnosis present

## 2013-11-25 LAB — CBC
Hemoglobin: 12.9 g/dL — ABNORMAL LOW (ref 13.0–17.0)
MCH: 34.6 pg — ABNORMAL HIGH (ref 26.0–34.0)
MCHC: 32.6 g/dL (ref 30.0–36.0)
Platelets: 101 10*3/uL — ABNORMAL LOW (ref 150–400)
RDW: 17 % — ABNORMAL HIGH (ref 11.5–15.5)
WBC: 5.8 10*3/uL (ref 4.0–10.5)

## 2013-11-25 LAB — BASIC METABOLIC PANEL
Calcium: 8.8 mg/dL (ref 8.4–10.5)
Chloride: 100 mEq/L (ref 96–112)
Creatinine, Ser: 1.57 mg/dL — ABNORMAL HIGH (ref 0.50–1.35)
GFR calc Af Amer: 49 mL/min — ABNORMAL LOW (ref >90)
GFR calc non Af Amer: 43 mL/min — ABNORMAL LOW (ref >90)
Glucose, Bld: 97 mg/dL (ref 70–99)
Potassium: 4.7 mEq/L (ref 3.5–5.1)
Sodium: 139 mEq/L (ref 135–145)

## 2013-11-25 MED ORDER — POLYETHYLENE GLYCOL 3350 17 G PO PACK
17.0000 g | PACK | Freq: Every day | ORAL | Status: DC
  Administered 2013-11-25 – 2013-11-26 (×2): 17 g via ORAL
  Filled 2013-11-25 (×2): qty 1

## 2013-11-25 MED ORDER — DIGOXIN 0.0625 MG HALF TABLET
0.0625 mg | ORAL_TABLET | Freq: Every day | ORAL | Status: DC
  Administered 2013-11-25 – 2013-11-30 (×6): 0.0625 mg via ORAL
  Filled 2013-11-25 (×6): qty 1

## 2013-11-25 MED ORDER — HYDROCORTISONE 1 % EX CREA
TOPICAL_CREAM | Freq: Two times a day (BID) | CUTANEOUS | Status: DC
  Administered 2013-11-25 – 2013-11-26 (×4): via TOPICAL
  Administered 2013-11-27: 1 via TOPICAL
  Administered 2013-11-28 – 2013-11-29 (×3): via TOPICAL
  Filled 2013-11-25 (×2): qty 28

## 2013-11-25 MED ORDER — ENSURE COMPLETE PO LIQD
237.0000 mL | Freq: Three times a day (TID) | ORAL | Status: DC
  Administered 2013-11-25 – 2013-11-27 (×4): 237 mL via ORAL
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
## 2013-11-25 NOTE — Progress Notes (Signed)
[facility]  Patient Status: New  [facility] is providing the following services: RN and PT Was referred to [facility] by Dr. Duanne Guess but came to the ED prior to being seen.  If patient discharges after hours, please call [phone].   Chase [name] 11/25/2013, 9:54 AM

## 2013-11-25 NOTE — Care Management Note (Addendum)
    Page 1 of 1   11/25/2013     11:22:24 AM   CARE MANAGEMENT NOTE 11/25/2013  Patient:  Chase [name], Chase [name]   Account Number:  [id number]  Date Initiated:  11/25/2013  Documentation initiated by:  Junius Creamer  Subjective/Objective Assessment:   adm w pul embolus     Action/Plan:   lives alone   Anticipated DC Date:     Anticipated DC Plan:  HOME W HOME HEALTH SERVICES      DC Planning Services  CM consult      [facility] Choice  Resumption Of Svcs/PTA Provider   Choice offered to / List presented to:          [facility] arranged  HH-1 RN  HH-2 PT      [facility] agency  [facility]   Status of service:   Medicare Important Message given?   (If response is "NO", the following Medicare IM given date fields will be blank) Date Medicare IM given:   Date Additional Medicare IM given:    Discharge Disposition:  HOME W HOME HEALTH SERVICES  Per UR Regulation:  Reviewed for med. necessity/level of care/duration of stay  If discussed at Long Length of Stay Meetings, dates discussed:    Comments:

## 2013-11-25 NOTE — Progress Notes (Signed)
ANTICOAGULATION CONSULT NOTE - Follow Up Consult  Pharmacy Consult for Heparin  Indication: pulmonary embolus  No Known Allergies  Patient Measurements: Height: 6\' 3"  (190.5 cm) Weight: 175 lb 4.3 oz (79.5 kg) IBW/kg (Calculated) : 84.5  Vital Signs: Temp: 98.2 F (36.8 C) (11/28 0820) Temp src: Oral (11/28 0820) BP: 106/74 mmHg (11/28 0820) Pulse Rate: 80 (11/28 0820)  Labs:  Recent Labs  11/23/13 0100  11/23/13 1514 11/23/13 1532 11/23/13 2140 11/24/13 0427 11/24/13 0845 11/24/13 1936 11/25/13 0502  HGB  --   < > 13.0  --   --  12.3*  --   --  12.9*  HCT  --   --  38.8*  --   --  37.8*  --   --  39.6  PLT  --   --  99*  --   --  81*  --   --  101*  APTT  --   --   --  35  --   --   --   --   --   LABPROT  --   --   --  18.4*  --  19.7*  --   --   --   INR  --   --   --  1.58*  --  1.72*  --   --   --   HEPARINUNFRC 0.37  --   --   --  >2.20* 0.51 0.78* 0.50 0.50  CREATININE  --   --  1.39*  --   --  1.34  --   --  1.57*  TROPONINI  --   --  <0.30  --  <0.30 <0.30  --   --   --   < > = values in this interval not displayed.  Estimated Creatinine Clearance: 48.5 ml/min (by C-G formula based on Cr of 1.57).   Medications:  Heparin 1200 units/hr  Assessment: 72  Y/o M on heparin for PE. Heparin level 0.5. No bleeding noted. CBC remains stable.  Goal of Therapy:  Heparin level 0.3-0.7 units/ml Monitor platelets by anticoagulation protocol: Yes   Plan:  -Continue heparin at current rate-1200 units/hr, recheck with am labs  -Daily CBC/HL -Monitor for bleeding  Thank you for allowing pharmacy to be a part of this patients care team.  Sheppard Coil PharmD., BCPS Clinical Pharmacist Pager [phone] 11/25/2013 10:35 AM

## 2013-11-25 NOTE — Evaluation (Addendum)
Physical Therapy Evaluation Patient Details Name: Chase [name] MRN: [id number] DOB: [birth year] Today's Date: 11/25/2013 Time: 1342-1410 PT Time Calculation (min): 28 min  PT Assessment / Plan / Recommendation History of Present Illness  65 M with hx of PE 2yrs ago after LE trauma admitted with 3 wks of increasing LE edema and DOE. CTA chest revealed large PE. PCCM asked to see due to mild hypotension  Clinical Impression  Pt with decreased activity tolerance, respiratory status and limited mobility impacting ability to care for himself and return home at this point. Pt will benefit from acute therapy to maximize mobility, function and activity to decrease burden of care and address below deficits. Pt currently with therapeutic heparin with sats 87-97% on 3L throughout mobility. Cueing throughout for breathing technique as pt very talkative and difficult to redirect to respiratory status at times. Will follow.     PT Assessment  Patient needs continued PT services    Follow Up Recommendations  SNF;Supervision for mobility/OOB    Does the patient have the potential to tolerate intense rehabilitation      Barriers to Discharge Decreased caregiver support      Equipment Recommendations  None recommended by PT    Recommendations for Other Services     Frequency Min 3X/week    Precautions / Restrictions Precautions Precautions: Fall Precaution Comments: watch sats   Pertinent Vitals/Pain Pt reports pain at hemorrhoid     Mobility  Bed Mobility Bed Mobility: Rolling Right;Right Sidelying to Sit;Sitting - Scoot to [place] of Bed Rolling Right: 4: Min assist;With rail Right Sidelying to Sit: 4: Min assist;HOB elevated Sitting - Scoot to [place] of Bed: 5: Supervision Details for Bed Mobility Assistance: cueing for sequence with assist to elevate trunk and use of rail to roll Transfers Transfers: Sit to Stand;Stand to Sit;Stand Pivot Transfers Sit to Stand: 4: Min assist;From  bed Stand to Sit: 4: Min assist;To chair/3-in-1;With armrests Stand Pivot Transfers: 4: Min assist Details for Transfer Assistance: cueing for hand placement and safety with assist to pivot and control pelvis Ambulation/Gait Ambulation/Gait Assistance: Not tested (comment) (pt denied attempting)    Exercises     PT Diagnosis: Difficulty walking  PT Problem List: Decreased activity tolerance;Decreased mobility;Decreased knowledge of use of DME;Cardiopulmonary status limiting activity PT Treatment Interventions: Gait training;DME instruction;Functional mobility training;Therapeutic activities;Therapeutic exercise;Patient/family education     PT Goals(Current goals can be found in the care plan section) Acute Rehab PT Goals Patient Stated Goal: be able to return home PT Goal Formulation: With patient Time For Goal Achievement: 12/09/13 Potential to Achieve Goals: Fair  Visit Information  Last PT Received On: 11/25/13 Assistance Needed: +1 History of Present Illness: [name] with hx of PE 37yrs ago after LE trauma admitted with 3 wks of increasing LE edema and DOE. CTA chest revealed large PE. PCCM asked to see due to mild hypotension       Prior Functioning  Home Living Family/patient expects to be discharged to:: Private residence Living Arrangements: Alone Type of Home: Mobile home Home Access: Stairs to enter Entrance Stairs-Number of Steps: 4 Entrance Stairs-Rails: Right Home Layout: One level Home Equipment: Walker - 2 wheels;Cane - single point Prior Function Level of Independence: Independent with assistive device(s) Comments: for the last 4wks has barely  been able to get to the bathroom and neighbor has been helping with errands and chores Communication Communication: No difficulties    Cognition  Cognition Arousal/Alertness: Awake/alert Behavior During Therapy: [facility] for tasks assessed/performed Overall  Cognitive Status: Impaired/Different from baseline Area of  Impairment: Safety/judgement;Attention Current Attention Level: Sustained General Comments: pt perseverates at times on hemorrhoid, father and what's in his chart    Extremity/Trunk Assessment Upper Extremity Assessment Upper Extremity Assessment: Defer to OT evaluation Lower Extremity Assessment Lower Extremity Assessment: Overall WFL for tasks assessed (5/5 hip and knee flexion RLE, LLE 4/5) Cervical / Trunk Assessment Cervical / Trunk Assessment: Normal   Balance Balance Balance Assessed: Yes Static Sitting Balance Static Sitting - Balance Support: Bilateral upper extremity supported;Feet supported Static Sitting - Level of Assistance: 6: Modified independent (Device/Increase time) Static Sitting - Comment/# of Minutes: pt with tendency to revert to tripod sitting throughout  End of Session PT - End of Session Equipment Utilized During Treatment: Gait belt Activity Tolerance: Patient limited by fatigue Patient left: in chair;with call bell/phone within reach Nurse Communication: Mobility status  GP     Delorse Lek 11/25/2013, 2:31 PM Delaney Meigs, PT [phone]

## 2013-11-25 NOTE — Progress Notes (Signed)
Pharmacist Heart Failure Core Measure Documentation  Assessment: Chase [name] has an EF documented as 15% on 11/24/13 by echo.  Rationale: Heart failure patients with left ventricular systolic dysfunction (LVSD) and an EF < 40% should be prescribed an angiotensin converting enzyme inhibitor (ACEI) or angiotensin receptor blocker (ARB) at discharge unless a contraindication is documented in the medical record.  This patient is not currently on an ACEI or ARB for HF.  This note is being placed in the record in order to provide documentation that a contraindication to the use of these agents is present for this encounter.  ACE Inhibitor or Angiotensin Receptor Blocker is contraindicated (specify all that apply)  []   ACEI allergy AND ARB allergy []   Angioedema []   Moderate or severe aortic stenosis []   Hyperkalemia [x]   Hypotension []   Renal artery stenosis [x]   Worsening renal function, preexisting renal disease or dysfunction   [name] 11/25/2013 10:43 AM

## 2013-11-25 NOTE — Evaluation (Signed)
Occupational Therapy Evaluation Patient Details Name: Chase [name] MRN: [id number] DOB: 11/26/1941 Today's Date: 11/25/2013 Time: 4098-1191 OT Time Calculation (min): 23 min  OT Assessment / Plan / Recommendation History of present illness 82 M with hx of PE 19yrs ago after LE trauma admitted with 3 wks of increasing LE edema and DOE. CTA chest revealed large PE. PCCM asked to see due to mild hypotension   Clinical Impression   PT admitted with PE and x3 weeks of decr mobility. Pt currently with functional limitiations due to the deficits listed below (see OT problem list).  Pt will benefit from skilled OT to increase their independence and safety with adls and balance to allow discharge SNF.     OT Assessment  Patient needs continued OT Services    Follow Up Recommendations  SNF    Barriers to Discharge Decreased caregiver support    Equipment Recommendations  3 in 1 bedside comode    Recommendations for Other Services    Frequency  Min 2X/week    Precautions / Restrictions Precautions Precautions: Fall Precaution Comments: watch sats   Pertinent Vitals/Pain vtach in sitting on 3L oxygen     ADL  Eating/Feeding: Independent Where Assessed - Eating/Feeding: Chair Grooming: Wash/dry hands;Wash/dry face;Teeth care;Supervision/safety Where Assessed - Grooming: Supported sitting Upper Body Bathing: Chest;Right arm;Left arm;Abdomen;Supervision/safety Where Assessed - Upper Body Bathing: Supported sitting Upper Body Dressing: Minimal assistance Where Assessed - Upper Body Dressing: Supported sitting Toilet Transfer:  (just transfered with PT [name] (see PT EVAL)) ADL Comments: pt ending PT session arrival. Pt sitting in chair to complete grooming. Pt needed cues to redirect to task. Pt with VTach showing on monitor with sitting task. Pt c/o hemorroid in reclined position and sitting upright in chair.     OT Diagnosis: Generalized weakness;Cognitive deficits;Acute pain  OT  Problem List: Decreased strength;Impaired balance (sitting and/or standing);Decreased activity tolerance;Decreased cognition;Decreased safety awareness;Decreased knowledge of use of DME or AE;Decreased knowledge of precautions;Pain OT Treatment Interventions: Self-care/ADL training;Therapeutic exercise;Neuromuscular education;DME and/or AE instruction;Therapeutic activities;Patient/family education;Balance training   OT Goals(Current goals can be found in the care plan section) Acute Rehab OT Goals Patient Stated Goal: be able to return home OT Goal Formulation: With patient Time For Goal Achievement: 12/09/13 Potential to Achieve Goals: Good  Visit Information  Last OT Received On: 11/25/13 Assistance Needed: +1 History of Present Illness: 55 M with hx of PE 69yrs ago after LE trauma admitted with 3 wks of increasing LE edema and DOE. CTA chest revealed large PE. PCCM asked to see due to mild hypotension       Prior Functioning     Home Living Family/patient expects to be discharged to:: Private residence Living Arrangements: Alone Type of Home: Mobile home Home Access: Stairs to enter Entrance Stairs-Number of Steps: 4 Entrance Stairs-Rails: Right Home Layout: One level Home Equipment: Walker - 2 wheels;Cane - single point Prior Function Level of Independence: Independent with assistive device(s) Comments: for the last 4wks has barely  been able to get to the bathroom and neighbor has been helping with errands and chores Communication Communication: No difficulties Dominant Hand: Right         Vision/Perception Vision - History Baseline Vision: No visual deficits Patient Visual Report: No change from baseline   Cognition  Cognition Arousal/Alertness: Awake/alert Behavior During Therapy: WFL for tasks assessed/performed Overall Cognitive Status: Impaired/Different from baseline Area of Impairment: Safety/judgement;Attention Current Attention Level:  Sustained General Comments: pt talking about topics not related directly to the  session. Pt talking about religion and his study of religion during grooming unrelated to topic. Pt perseverating on doctor needing to review medical history for dates. Pt unable to recall CVA and lobectomy surgery.     Extremity/Trunk Assessment Upper Extremity Assessment Upper Extremity Assessment: Generalized weakness Lower Extremity Assessment Lower Extremity Assessment: Defer to PT evaluation Cervical / Trunk Assessment Cervical / Trunk Assessment: Normal     Mobility Bed Mobility Bed Mobility: Not assessed Rolling Right: 4: Min assist;With rail Right Sidelying to Sit: 4: Min assist;HOB elevated Sitting - Scoot to Edge of Bed: 5: Supervision Details for Bed Mobility Assistance: cueing for sequence with assist to elevate trunk and use of rail to roll Transfers Transfers: Not assessed (see PT eval- transfer complete on OT arrival) Sit to Stand: 4: Min assist;From bed Stand to Sit: 4: Min assist;To chair/3-in-1;With armrests Details for Transfer Assistance: cueing for hand placement and safety with assist to pivot and control pelvis     Exercise     Balance Balance Balance Assessed: Yes Static Sitting Balance Static Sitting - Balance Support: Bilateral upper extremity supported;Feet supported Static Sitting - Level of Assistance: 6: Modified independent (Device/Increase time) Static Sitting - Comment/# of Minutes: pt with tendency to revert to tripod sitting throughout   End of Session OT - End of Session Activity Tolerance: Patient tolerated treatment well Patient left: in chair;with call bell/phone within reach Nurse Communication: Mobility status;Precautions  GO     [name] 11/25/2013, 2:48 PM Pager: [phone]

## 2013-11-25 NOTE — Progress Notes (Addendum)
During bathing process, while pt was standing to remove underwear and apply hemorrhoid cream, pt suddenly and unexpectedly sat down on the floor. Pt had no complaints immediately following the fall and stated "This is all my fault, I should have just sat down in the bed." With the assistance of 2 additional nurses, MaxiMove was utilized to place pt back to bed. No trauma was noted in assessment, and VSS. Pt continued to deny pain, no change in MS noted, able to perform FROM without pain, no pain on palpation of bony prominences. Dr notified and sister, Dois Davenport, updated. Pt had yellow armband, red socks in place prior to fall. Post fall, bed alarm was initiated, and post fall huddle completed.  During conversation with sister, she continued to express concern that pt has not had a BM since hospitalization. Throughout the course of the day, pt has been encouraged to increase PO intake of fluid and been offered prune juice. BS active, pt passing flatus, abd soft/nt. Drs aware of complaint of constipation.
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
## 2013-11-25 NOTE — Progress Notes (Signed)
PULMONARY/CCM NOTE  Requesting MD/Service: IMTS Date of admission: 11/26 Date of consult: 11/26 Reason for consultation:  PE  Pt Profile:  75 M with hx of PE 50yrs ago after LE trauma admitted with 3 wks of increasing LE edema and DOE. CTA chest revealed large PE. PCCM asked to see due to mild hypotension  Filed Vitals:   11/25/13 0000 11/25/13 0400 11/25/13 0500 11/25/13 0820  BP: 100/76 95/70  106/74  Pulse: 96 94  80  Temp: 98.9 F (37.2 C) 97.7 F (36.5 C)  98.2 F (36.8 C)  TempSrc: Oral Oral  Oral  Resp: 33 22  19  Height:      Weight:   79.5 kg (175 lb 4.3 oz)   SpO2: 100% 100%  100%    EXAM:  Gen: pleasant, NAD HEENT: WNL Neck: + JVD Lungs: clear anteriorly without wheezes Cardiovascular: irregular, no M noted Abdomen: soft, NT, NABS Ext: 3+ BLE edema slightly worse on L than R, early chronic stasis changes Neuro: intact  DATA:  I have reviewed all of today's lab results. Relevant abnormalities are discussed in the A/P section  EKG: RBBB, ventricular ectopy  CXR: CM, interstitial prominence, minimal blunting of R CP angle  CT chest: CM, large PAs, large PE in R main artery, small B effusions   IMPRESSION:   Submassive pulmonary embolus - recurrent  Not a presently candidate for thrombolytics due to prior CNS bleed, lower ext dopplers neg. Pulmonary HTN -RVSP 60 Severe BLE edema H/O CHF (congestive heart failure) -EF 15% Mild thrombocytopenia - likely consumptive Macrocytosis  PLAN:  - Continue heparin. -No benefit from IVC filter since no DVT -a gree with Oncology - Will need lifelong anticoagulation - Fall risk noted, also dropping plts a concern -can consider novel agent if further drop - Absolute contraindication for tPA noted, will not lyse. - LikelFU echo in 6months & consider CTEP if PH persists - Will likely need home O2, once more stable will need an ambulatory desat study.  PCCM to sign off, we can see him as outpt after rpt echo in 36m or  sooner depending on course  Cyril Mourning MD. FCCP. [facility] Pager [phone] If no response call [phone]

## 2013-11-26 ENCOUNTER — Inpatient Hospital Stay (HOSPITAL_COMMUNITY): Payer: Medicare Other

## 2013-11-26 DIAGNOSIS — I959 Hypotension, unspecified: Secondary | ICD-10-CM | POA: Diagnosis present

## 2013-11-26 DIAGNOSIS — E875 Hyperkalemia: Secondary | ICD-10-CM | POA: Diagnosis present

## 2013-11-26 DIAGNOSIS — E162 Hypoglycemia, unspecified: Secondary | ICD-10-CM | POA: Diagnosis present

## 2013-11-26 DIAGNOSIS — R791 Abnormal coagulation profile: Secondary | ICD-10-CM | POA: Diagnosis present

## 2013-11-26 LAB — BASIC METABOLIC PANEL
Calcium: 9.1 mg/dL (ref 8.4–10.5)
Chloride: 97 mEq/L (ref 96–112)
Creatinine, Ser: 1.63 mg/dL — ABNORMAL HIGH (ref 0.50–1.35)
GFR calc Af Amer: 47 mL/min — ABNORMAL LOW (ref >90)

## 2013-11-26 LAB — CBC
HCT: 38.6 % — ABNORMAL LOW (ref 39.0–52.0)
MCH: 35.4 pg — ABNORMAL HIGH (ref 26.0–34.0)
MCHC: 33.4 g/dL (ref 30.0–36.0)
MCV: 106 fL — ABNORMAL HIGH (ref 78.0–100.0)
Platelets: 104 10*3/uL — ABNORMAL LOW (ref 150–400)
RDW: 17.2 % — ABNORMAL HIGH (ref 11.5–15.5)
WBC: 6.5 10*3/uL (ref 4.0–10.5)

## 2013-11-26 LAB — PROTIME-INR
INR: 2.94 — ABNORMAL HIGH (ref 0.00–1.49)
Prothrombin Time: 28.5 seconds — ABNORMAL HIGH (ref 11.6–15.2)

## 2013-11-26 LAB — OCCULT BLOOD X 1 CARD TO LAB, STOOL: Fecal Occult Bld: NEGATIVE

## 2013-11-26 MED ORDER — COUMADIN BOOK
Freq: Once | Status: DC
  Filled 2013-11-26: qty 1

## 2013-11-26 MED ORDER — POLYETHYLENE GLYCOL 3350 17 G PO PACK
17.0000 g | PACK | Freq: Every day | ORAL | Status: DC | PRN
  Filled 2013-11-26: qty 1

## 2013-11-26 MED ORDER — DEXTROSE 50 % IV SOLN: 25.0000 mL | Freq: Once | INTRAVENOUS | Status: AC | PRN

## 2013-11-26 MED ORDER — WARFARIN VIDEO: Freq: Once | Status: DC

## 2013-11-26 MED ORDER — GLUCOSE 40 % PO GEL: 1.0000 | ORAL | Status: DC | PRN

## 2013-11-26 MED ORDER — DOCUSATE SODIUM 100 MG PO CAPS
100.0000 mg | ORAL_CAPSULE | Freq: Every day | ORAL | Status: DC | PRN
  Filled 2013-11-26: qty 1

## 2013-11-26 MED ORDER — WARFARIN - PHARMACIST DOSING INPATIENT: Freq: Every day | Status: DC

## 2013-11-26 NOTE — Progress Notes (Addendum)
ANTICOAGULATION CONSULT NOTE - Follow Up Consult for Heparin Initial consult for : Coumadin  Pharmacy Consult for Heparin /Coumadin Indication: pulmonary embolus  No Known Allergies  Patient Measurements: Height: 6\' 3"  (190.5 cm) Weight: 177 lb 11.1 oz (80.6 kg) IBW/kg (Calculated) : 84.5 Heparin dosing weight: 80.6 kg  Vital Signs: Temp: 97.3 F (36.3 C) (11/29 0808) Temp src: Oral (11/29 0808) BP: 89/73 mmHg (11/29 0808) Pulse Rate: 76 (11/29 0808)  Labs:  Recent Labs  11/23/13 2140  11/24/13 0427  11/24/13 1936 11/25/13 0502 11/26/13 0419 11/26/13 0841 11/26/13 1340  HGB  --   < > 12.3*  --   --  12.9* 12.9*  --   --   HCT  --   --  37.8*  --   --  39.6 38.6*  --   --   PLT  --   --  81*  --   --  101* 104*  --   --   LABPROT  --   --  19.7*  --   --   --   --  28.5* 29.6*  INR  --   --  1.72*  --   --   --   --  2.80* 2.94*  HEPARINUNFRC >2.20*  --  0.51  < > 0.50 0.50 0.34  --   --   CREATININE  --   --  1.34  --   --  1.57* 1.63*  --   --   TROPONINI <0.30  --  <0.30  --   --   --   --   --   --   < > = values in this interval not displayed.  Estimated Creatinine Clearance: 47.4 ml/min (by C-G formula based on Cr of 1.63).   Medications:  Heparin 1200 units/hr  Assessment: 72  Y/o M on heparin drip 1200 units/hr  For acute PE significant clot burden. Terapeutic heparin level of 0.34 today.   Patient was not on coumadin PTA.  Baseline INR elevated (=1.58)on admit date 11/23/13.  INR increased to 1.72 on 11/27; no coumadin given.  This AM the "add-on" PT/INR result was increased to 2.8.   A repeat INR later today resulted as 2.94. Cardiologist notes today: Probable DIC with elevated INR and PTT in a patient with submassive PE probably consumption coagulopathy. Anticoagulation per hematology.  PLTC = 104K; Thrombocytopenia: Unlikely HIT. MD suspecting underlying cirrhosis. RUQ U/S.     Goal of Therapy:  Heparin level 0.3-0.7 units/ml Monitor platelets by  anticoagulation protocol: Yes INR =2-3   Plan:  -Continue heparin at current rate-1200 units/hr.  -Daily CBC/HL, PT/INR -Monitor for bleeding  - No coumadin dose today due to elevated INR 2.94 (without coumadin).  Consider hematology consult.   Thank you for allowing pharmacy to be a part of this patients care team. Noah Delaine, RPh Clinical Pharmacist Pager: [phone] 11/26/2013 8:30 AM     Labs:  Recent Labs  11/23/13 2140  11/24/13 0427  11/24/13 1936 11/25/13 0502 11/26/13 0419 11/26/13 0841 11/26/13 1340  HGB  --   < > 12.3*  --   --  12.9* 12.9*  --   --   HCT  --   --  37.8*  --   --  39.6 38.6*  --   --   PLT  --   --  81*  --   --  101* 104*  --   --   LABPROT  --   --  19.7*  --   --   --   --  28.5* 29.6*  INR  --   --  1.72*  --   --   --   --  2.80* 2.94*  HEPARINUNFRC >2.20*  --  0.51  < > 0.50 0.50 0.34  --   --   CREATININE  --   --  1.34  --   --  1.57* 1.63*  --   --   TROPONINI <0.30  --  <0.30  --   --   --   --   --   --   < > = values in this interval not displayed.  Estimated Creatinine Clearance: 47.4 ml/min (by C-G formula based on Cr of 1.63).

## 2013-11-26 NOTE — Progress Notes (Signed)
Subjective: Pt was nauseated last night but never threw up last night.  He had a bowel movement overnight.  He states his breathing was sob last night but has improved today.  He is peeing 1-2 x per day.  He is agreeable to SNF.  He is ok if we update his sister with his medical status.  His appetite is decreased   RN: pt having a lot of PVCs  Objective: Vital signs in last 24 hours: Filed Vitals:   11/26/13 0451 11/26/13 0600 11/26/13 0808 11/26/13 1000  BP:  89/57 89/73 99/69   Pulse:  83 76 73  Temp:   97.3 F (36.3 C)   TempSrc:   Oral   Resp:  21 26 22   Height:      Weight: 177 lb 11.1 oz (80.6 kg)     SpO2:  100% 97% 100%   Weight change: 2 lb 6.8 oz (1.1 kg)  Intake/Output Summary (Last 24 hours) at 11/26/13 1114 Last data filed at 11/26/13 0800  Gross per 24 hour  Intake    794 ml  Output    200 ml  Net    594 ml   Vitals reviewed. General: resting in bed, NAD HEENT: [place]/at, no scleral icterus Cardiac: RRR, no rubs, murmurs or gallops Pulm: clear to auscultation bilaterally, no wheezes, rales, or rhonchi Abd: soft, nontender, nondistended, BS present Ext: warm and well perfused, no pedal edema Neuro: alert and oriented X3, cranial nerves II-XII grossly intact Skin: ecchymoses to skin, spider veins to face   Lab Results: Basic Metabolic Panel:  Recent Labs Lab 11/23/13 1514 11/23/13 2140  11/25/13 0502 11/26/13 0419  NA 141  --   < > 139 138  K 3.9  --   < > 4.7 5.3*  CL 99  --   < > 100 97  CO2 27  --   < > 22 19  GLUCOSE 84  --   < > 97 54*  BUN 45*  --   < > 49* 58*  CREATININE 1.39*  --   < > 1.57* 1.63*  CALCIUM 9.2  --   < > 8.8 9.1  MG  --  2.1  --   --   --   PHOS  --  4.0  --   --   --   < > = values in this interval not displayed. Liver Function Tests:  Recent Labs Lab 11/23/13 1514 11/24/13 0427  AST 66* 68*  ALT 69* 71*  ALKPHOS 101 108  BILITOT 2.0* 1.8*  PROT 5.5* 5.0*  ALBUMIN 3.4* 3.1*   CBC:  Recent Labs Lab  11/23/13 1514  11/25/13 0502 11/26/13 0419  WBC 4.8  < > 5.8 6.5  NEUTROABS 3.0  --   --   --   HGB 13.0  < > 12.9* 12.9*  HCT 38.8*  < > 39.6 38.6*  MCV 103.7*  < > 106.2* 106.0*  PLT 99*  < > 101* 104*  < > = values in this interval not displayed. Cardiac Enzymes:  Recent Labs Lab 11/23/13 1514 11/23/13 2140 11/24/13 0427  TROPONINI <0.30 <0.30 <0.30   BNP:  Recent Labs Lab 11/23/13 1514  PROBNP 37244.0*    Coagulation:  Recent Labs Lab 11/23/13 1532 11/24/13 0427 11/26/13 0841  LABPROT 18.4* 19.7* 28.5*  INR 1.58* 1.72* 2.80*   Misc. Labs: Hepatitis panel    Micro Results: Recent Results (from the past 240 hour(s))  MRSA PCR SCREENING  Status: None   Collection Time    11/23/13  7:16 PM      Result Value Range Status   MRSA by PCR NEGATIVE  NEGATIVE Final   Comment:            The GeneXpert MRSA Assay (FDA     approved for NASAL specimens     only), is one component of a     comprehensive MRSA colonization     surveillance program. It is not     intended to diagnose MRSA     infection nor to guide or     monitor treatment for     MRSA infections.   Studies/Results: No results found. Medications:  Scheduled Meds: . coumadin book   Does not apply Once  . digoxin  0.0625 mg Oral Daily  . feeding supplement (ENSURE COMPLETE)  237 mL Oral TID BM  . hydrocortisone cream   Topical BID  . warfarin   Does not apply Once  . Warfarin - Pharmacist Dosing Inpatient   Does not apply q1800   Continuous Infusions: . sodium chloride 10 mL/hr at 11/23/13 2305  . heparin 1,200 Units (11/26/13 0800)   PRN Meds:.albuterol, dextrose, dextrose, docusate sodium, polyethylene glycol Assessment/Plan: 72 y.o presented with shortness of breath with massive pulmonary embolism, pulmonary HTN found to have severely reduced EF.    #Pulmonary embolus -Massive PE with pulmonary HTN, EF 15% with hypokinesis. LE Doppler negative for DVT  -Heparin gtt.  Coumadin per  pharmacy though pharmacy will not dose it today due to elevated INR. Will need lifelong anticoagulation per H/O -Pt will not get Coumadin today.  Once therapeutic INR transition off Heparin gtt  -Follow INR daily, will repeat stat INR to make sure INR accurate  -He has never had a malignancy w/u i.e colonoscopy.  Could consider CT ab/pelvis in the future   #Chronic systolic heart failure, ?diastolic HR history -EF 15%. Per pt last EF was 48% but no records  -Cards states patient has poor prognosis, placed patient on Digoxin 0.625 mg  -heart failure team consulted, cardiology following as well.    #Constipation -resolved pt had stool. will try Miralax, Colace prn   #Elevated serum creatinine -Creatinine trending up though not yet AKI (borderline). No known baseline  -trend BMET daily    #Protein-calorie malnutrition, severe -Nutrition consulted  -Patient having early satiety and no appetite may be related to underlying illnesses though he has never had a malignancy w/u.   #Thrombocytopenia -Unknown baseline.  ?etiology related to critical illness or underlying liver disease.  He used to drink alcohol but unsure how much.  -Trend CBC  #Elevated INR -will repeat INR today. Unsure if pt has underlying cirrhosis  -will order US abdomen, Hepatitis panel    #F/E/N -NSL -Hyper K 5.3. Repeat BMET in the am -cardiac diet  #DVT px  -Heparin gtt   Of Note: Collateral with sister Updated sister she said patient exaggerates sometimes and doesn't have a sense of time.  She states pt has not had a bowel movement in days and last time he was in the hospital last time and was constipated to the point where he had a stroke once he did have a bowel movement.  She is worried about her brother and may need to come down here soon.  She lives in [place].  She is aware of patient's status and wants to be his HCPOA.  She will call back and speak to RN or  social work.    Dispo: Disposition is  deferred at this time, awaiting improvement of current medical problems.  Anticipated discharge in approximately 3-5 day(s).   The patient does have a current PCP Maryelizabeth Rowan MD, [facility] and does not need an [facility] hospital follow-up appointment after discharge.  The patient does have transportation limitations that hinder transportation to clinic appointments.  Services Needed at time of discharge: Y = Yes, Blank = No PT: SNF  OT: SNF  RN:   Equipment: 3:1 bedside comode   Other:     LOS: 3 days   Annett Gula, MD [phone] 11/26/2013, 11:14 AM

## 2013-11-26 NOTE — Progress Notes (Signed)
   CARE MANAGEMENT NOTE 11/26/2013  Patient:  Chase [name], Chase [name]   Account Number:  [id number]  Date Initiated:  11/25/2013  Documentation initiated by:  Junius Creamer  Subjective/Objective Assessment:   adm w pul embolus     Action/Plan:   lives alone   Anticipated DC Date:     Anticipated DC Plan:  HOME W HOME HEALTH SERVICES      DC Planning Services  CM consult      [facility] Choice  Resumption Of Svcs/PTA Provider   Choice offered to / List presented to:     DME arranged  3-N-1      DME agency  [facility]     HH arranged  HH-1 RN  HH-2 PT      [facility] agency  [facility]   Status of service:  Completed, signed off Medicare Important Message given?   (If response is "NO", the following Medicare IM given date fields will be blank) Date Medicare IM given:   Date Additional Medicare IM given:    Discharge Disposition:  HOME W HOME HEALTH SERVICES  Per UR Regulation:  Reviewed for med. necessity/level of care/duration of stay  If discussed at Long Length of Stay Meetings, dates discussed:    Comments:  11/26/13 16:54 CM called to ensure 3n1 brought to room prior to discharge.  [facility] aware of Resumption orders.  No other CM needs were communicated.  No other CM needs were communicated.  Freddy Jaksch, BSN, CM [phone].

## 2013-11-26 NOTE — Progress Notes (Addendum)
Subjective:   Feels a little better.No specific complaints except has dyspnea and generalized weakness.  Objective:  Vital Signs in the last 24 hours: Temp:  [97.3 F (36.3 C)-98.3 F (36.8 C)] 97.3 F (36.3 C) (11/29 0808) Pulse Rate:  [71-103] 73 (11/29 1000) Resp:  [14-27] 22 (11/29 1000) BP: (84-106)/(57-74) 99/69 mmHg (11/29 1000) SpO2:  [91 %-100 %] 100 % (11/29 1000) Weight:  [80.6 kg (177 lb 11.1 oz)] 80.6 kg (177 lb 11.1 oz) (11/29 0451)  Intake/Output from previous day: 11/28 0701 - 11/29 0700 In: 838 [P.O.:300; I.V.:538] Out: 200 [Urine:200]  Physical Exam:  General appearance: alert, cooperative, appears older than stated age, fatigued and mild distress, elevated JVD up to the angle of the mandible with presence of hepatojugular reflux.  Lungs: clear to auscultation bilaterally and The lung sounds are decreased bilaterally, just appears to have emphysematous appearance with barrel-shaped.  Heart: S1, S2 normal and There is no gallop, there is a 1-2/6 long systolic murmur in the tricuspid area.  Abdomen: soft, non-tender; bowel sounds normal; no masses, no organomegaly and Hepatojugular reflux present  Extremities: edema 2 plus, pitting, bilateral. Full range of movements in all 4 extremities.  Neurologic: Grossly normal,    Lab Results:  Recent Labs  11/25/13 0502 11/26/13 0419  WBC 5.8 6.5  HGB 12.9* 12.9*  PLT 101* 104*    Recent Labs  11/25/13 0502 11/26/13 0419  NA 139 138  K 4.7 5.3*  CL 100 97  CO2 22 19  GLUCOSE 97 54*  BUN 49* 58*  CREATININE 1.57* 1.63*    Recent Labs  11/23/13 2140 11/24/13 0427  TROPONINI <0.30 <0.30   Hepatic Function Panel  Recent Labs  11/24/13 0427  PROT 5.0*  ALBUMIN 3.1*  AST 68*  ALT 71*  ALKPHOS 108  BILITOT 1.8*    EKG- Telemetry : Frequent PVC and ventricular couplets. 12 lead EKG did show NSVT 3 days ago.   Assessment/Plan:  1. Cardiomyopathy with biventricular failure. 2. Submassive  pulmonary embolism and severe pulmonary hypertension 3. NSVT 4. Probable DIC with elevated INR and PTT in a patient with submassive PE probably consumption coagulopathy.  Rec: No specific recommendation from cardiac standpoint. Continue Dig at low dose for inotropicity. Unable to use beta blockers or ACEi or hydralazine due to low BP and also renal failure. Consider Hospice consultation and extremely guarded prognosis with no significant options from cardiac standpoint. Anticoagulation per hematology.   Pamella Pert, M.D. 11/26/2013, 11:30 AM [facility] Pager: [phone] Office: [phone] If no answer: [phone]   I will signoff for now and please do not hesitate to contact me if you have any questions.

## 2013-11-26 NOTE — Progress Notes (Signed)
  Date: 11/26/2013  Patient name: Chase [name]  Medical record number: [id number]  Date of birth: 26-Dec-1941   This patient has been seen and the plan of care was discussed with the house staff. Please see their note for complete details. I concur with their findings with the following additions/corrections: States SOB is slightly better, denies CP. TTE noted w/ EF 15%.  On exam: GEN: NAD, AAOx3. CV: S1S2, no m/r/g, RR, +JVD. PULM: Decreased breath sounds, no wheeze.  -Acute PE: significant clot burden, but hemodynamically stable. Appreciate Heme recs. Will need coumadin lifelong. Currently on heparin gtt while bridging. -Thrombocytopenia: Unlikely HIT. Suspect underlying cirrhosis. Agree with RUQ U/S, but Plt count will need to be monitored daily. -CKD vs developing AKI?: would need records from [place] for baseline. Monitor UOP. Monitor K, if continues to rise given cardiac hx treat with kayexalate.  Given low EF and presentation with slow rise suspect cardiorenal physiology. This will be difficult to manage. He does not appear volume depleted, but if continues to rise would need very careful volume expansion to appreciate any response in Cr. -Chronic systolic heart failure (biventricular): poor prognosis. Now on digoxin. Appreciate cards input. -Will need [facility] for [facility] appropriate screening (colonoscopy), otherwise will need to investigate underlying malignancy as outpatient.  Jonah Blue, DO, FACP Faculty [facility] Internal Medicine Residency Program 11/26/2013, 11:39 AM

## 2013-11-27 DIAGNOSIS — I5022 Chronic systolic (congestive) heart failure: Secondary | ICD-10-CM

## 2013-11-27 DIAGNOSIS — Z515 Encounter for palliative care: Secondary | ICD-10-CM

## 2013-11-27 DIAGNOSIS — R791 Abnormal coagulation profile: Secondary | ICD-10-CM

## 2013-11-27 LAB — CBC
Hemoglobin: 12.1 g/dL — ABNORMAL LOW (ref 13.0–17.0)
MCV: 104.6 fL — ABNORMAL HIGH (ref 78.0–100.0)
Platelets: 90 10*3/uL — ABNORMAL LOW (ref 150–400)
RBC: 3.45 MIL/uL — ABNORMAL LOW (ref 4.22–5.81)
WBC: 5 10*3/uL (ref 4.0–10.5)

## 2013-11-27 LAB — FIBRINOGEN: Fibrinogen: 205 mg/dL (ref 204–475)

## 2013-11-27 LAB — BASIC METABOLIC PANEL
CO2: 24 mEq/L (ref 19–32)
Chloride: 100 mEq/L (ref 96–112)
Creatinine, Ser: 1.53 mg/dL — ABNORMAL HIGH (ref 0.50–1.35)
GFR calc Af Amer: 51 mL/min — ABNORMAL LOW (ref >90)
Glucose, Bld: 107 mg/dL — ABNORMAL HIGH (ref 70–99)

## 2013-11-27 LAB — HEPATITIS PANEL, ACUTE
HCV Ab: NEGATIVE
Hep A IgM: NONREACTIVE
Hep B C IgM: NONREACTIVE

## 2013-11-27 LAB — PROTIME-INR
INR: 2.61 — ABNORMAL HIGH (ref 0.00–1.49)
Prothrombin Time: 27 seconds — ABNORMAL HIGH (ref 11.6–15.2)

## 2013-11-27 MED ORDER — HEPARIN (PORCINE) IN NACL 100-0.45 UNIT/ML-% IJ SOLN
1400.0000 [IU]/h | INTRAMUSCULAR | Status: DC
  Administered 2013-11-27: 1400 [IU]/h via INTRAVENOUS
  Filled 2013-11-27 (×3): qty 250

## 2013-11-27 NOTE — Progress Notes (Addendum)
Subjective: Pt states his breathing is improved, no chest pain.  He is peeing 1-2 x per day.  Seen by palliative this morning to assess code status and goals of care.   Objective: Vital signs in last 24 hours: Filed Vitals:   11/27/13 0400 11/27/13 0500 11/27/13 0600 11/27/13 0800  BP: 88/65  87/57 85/62  Pulse: 59  62 64  Temp: 98 F (36.7 C)   97.4 F (36.3 C)  TempSrc: Oral   Oral  Resp: 15  18 17   Height:      Weight:  178 lb 12.7 oz (81.1 kg)    SpO2: 100%  100% 100%   Weight change: 1 lb 1.6 oz (0.5 kg)  Intake/Output Summary (Last 24 hours) at 11/27/13 1208 Last data filed at 11/27/13 1100  Gross per 24 hour  Intake    814 ml  Output    650 ml  Net    164 ml   Vitals reviewed. General: resting in bed, NAD HEENT: [place]/at, no scleral icterus Cardiac: RRR, no rubs, murmurs or gallops Pulm: clear to auscultation bilaterally, no wheezes, rales, or rhonchi Abd: soft, nontender, nondistended, BS present Ext: warm and well perfused, no pedal edema Neuro: alert and oriented X3, cranial nerves II-XII grossly intact Skin: ecchymoses to skin, spider veins to face   Lab Results: Basic Metabolic Panel:  Recent Labs Lab 11/23/13 1514 11/23/13 2140  11/26/13 0419 11/27/13 0520  NA 141  --   < > 138 138  K 3.9  --   < > 5.3* 5.1  CL 99  --   < > 97 100  CO2 27  --   < > 19 24  GLUCOSE 84  --   < > 54* 107*  BUN 45*  --   < > 58* 61*  CREATININE 1.39*  --   < > 1.63* 1.53*  CALCIUM 9.2  --   < > 9.1 8.7  MG  --  2.1  --   --   --   PHOS  --  4.0  --   --   --   < > = values in this interval not displayed.  Liver Function Tests:  Recent Labs Lab 11/23/13 1514 11/24/13 0427  AST 66* 68*  ALT 69* 71*  ALKPHOS 101 108  BILITOT 2.0* 1.8*  PROT 5.5* 5.0*  ALBUMIN 3.4* 3.1*   CBC:  Recent Labs Lab 11/23/13 1514  11/26/13 0419 11/27/13 0520  WBC 4.8  < > 6.5 5.0  NEUTROABS 3.0  --   --   --   HGB 13.0  < > 12.9* 12.1*  HCT 38.8*  < > 38.6* 36.1*  MCV  103.7*  < > 106.0* 104.6*  PLT 99*  < > 104* 90*  < > = values in this interval not displayed.  Cardiac Enzymes:  Recent Labs Lab 11/23/13 1514 11/23/13 2140 11/24/13 0427  TROPONINI <0.30 <0.30 <0.30   BNP:  Recent Labs Lab 11/23/13 1514  PROBNP 37244.0*    Coagulation:  Recent Labs Lab 11/24/13 0427 11/26/13 0841 11/26/13 1340 11/27/13 0520  LABPROT 19.7* 28.5* 29.6* 27.0*  INR 1.72* 2.80* 2.94* 2.61*   Misc. Labs: Hepatitis panel pending  Micro Results: Recent Results (from the past 240 hour(s))  MRSA PCR SCREENING     Status: None   Collection Time    11/23/13  7:16 PM      Result Value Range Status   MRSA by PCR NEGATIVE  NEGATIVE Final   Comment:            The GeneXpert MRSA Assay (FDA     approved for NASAL specimens     only), is one component of a     comprehensive MRSA colonization     surveillance program. It is not     intended to diagnose MRSA     infection nor to guide or     monitor treatment for     MRSA infections.   Studies/Results: US Abdomen Complete  11/26/2013   CLINICAL DATA:  72 year old male with abdominal pain and elevated INR.  EXAM: ULTRASOUND ABDOMEN COMPLETE  COMPARISON:  None.  FINDINGS: Gallbladder:  Gallbladder sludge is noted. Moderate gallbladder wall thickening is present which may be related to hepatic dysfunction or ascites, although cholecystitis is not entirely excluded. There is no evidence of cholelithiasis or sonographic Murphy sign.  Common bile duct:  Diameter: 4.2 mm. There is no evidence of intrahepatic or extrahepatic biliary dilatation.  Liver:  No morphologic evidence of cirrhosis is identified on this study. Hepatic echogenicity is unremarkable. No focal hepatic abnormalities are noted.  IVC:  The IVC is dilated compatible with this patient's right heart strain.  Pancreas:  Visualized portion unremarkable.  Spleen:  Size and appearance within normal limits.  Right Kidney:  Length: 10.8 cm. Echogenicity within  normal limits. No mass or hydronephrosis visualized.  Left Kidney:  Length: 10.7 cm. A 2.8 cm cyst is present. Echogenicity within normal limits. No solid mass or hydronephrosis visualized.  Abdominal aorta:  No aneurysm visualized.  Other findings:  Bilateral pleural effusions and small amount of ascites noted.  IMPRESSION: Gallbladder wall thickening and gallbladder sludge. This gallbladder wall thickening is felt to the be related to hepatic dysfunction or ascites. If there is strong clinical suspicion for acute cholecystitis, consider nuclear medicine study.  No morphologic evidence of cirrhosis identified on this study.  Bilateral pleural effusions and small amount of ascites.   Electronically Signed   By: Laveda Abbe M.D.   On: 11/26/2013 20:33   Medications:  Scheduled Meds: . coumadin book   Does not apply Once  . digoxin  0.0625 mg Oral Daily  . feeding supplement (ENSURE COMPLETE)  237 mL Oral TID BM  . hydrocortisone cream   Topical BID  . warfarin   Does not apply Once  . Warfarin - Pharmacist Dosing Inpatient   Does not apply q1800   Continuous Infusions: . sodium chloride 10 mL/hr at 11/27/13 0800  . heparin 1,200 Units (11/27/13 0800)   PRN Meds:.albuterol, dextrose, docusate sodium, polyethylene glycol Assessment/Plan: 72 y.o presented with shortness of breath with submassive pulmonary embolism, pulmonary HTN found to have severely reduced EF (15%).    #Pulmonary embolus- submassive PE with pulmonary HTN (PA pressure 60), EF 15% with diffuse hypokinesis.  BLE Dopplers negative for DVT.  He has reportedly not had a malignancy w/u i.e. colonoscopy thus should try to obtain records from [place] tomorrow and could consider CT abd/pelvis in future.  -hem-onc consult (Dr. Rosie Fate), appreciate recs; patient will not get Coumadin again today given INR 2.6 (though decreased from 2.9 yesterday); hem-onc requested add-on fibrinogen level and will look at peripheral smear to evaluate for DIC given  thrombocytopenia (see below) -continue Heparin gtt, sill need lifelong anticoagulation per hemo-onc -palliative consult, appreciate recs; patient remains full code at this time -repeat INR in AM  #Chronic systolic heart failure, ?diastolic- EF 15% this admission. Per pt last EF  was 48% but no records.  Will try to obtain records from [place] tomorrow.  -continue Digoxin 0.625 mg daily per cards recs, poor prognosis -heart failure team consulted as well  #Thrombocytopenia- Unknown baseline, platelets 90 today (down from 104 yesterday).  ?etiology related to chronic illness or underlying liver disease.  Unlikely to be HIT.  Hem-onc requested add-on fibrinogen level and will look at peripheral smear to evaluate for DIC.  Abdominal ultrasound yesterday showed no evidence of cirrhosis.  Of note, patient does have elevated MCV with low Hgb thus could evaluate folate and B12 deficiency in outpatient setting.  -daily CBCs  #Elevated INR- Likely consumptive coagulopathy; also possible that patient has low vitamin K intake through diet which could be contributing. Abdominal ultrasound showed gallbladder wall thickening, no evidence of cirrhosis with small amount of ascites.  Hepatitis panel pending.   -INR in AM, follow-up hepatitis panel   #Elevated serum creatinine- Baseline Cr unknown. Cr 1.5 today, down from 1.6 yesterday.  -continue to monitor, no IVFs for now   #Protein-calorie malnutrition, severe- Patient having early satiety and no appetite which may be due to chronic illness but again, malignancy workup is warranted.  Also possible that vitamin K intake low thereby contributing to elevated INR.   -Nutrition consulted   #F/E/N -NSL -BMP in AM -cardiac diet  #DVT PPX  -Heparin gtt   Dispo: Disposition is deferred at this time, awaiting improvement of current medical problems.  Anticipated discharge in approximately 2-3 day(s).   The patient does have a current PCP Maryelizabeth Rowan MD,  [facility] and does not need an [facility] hospital follow-up appointment after discharge.   Services Needed at time of discharge: Y = Yes, Blank = No PT: SNF  OT: SNF  RN:   Equipment: 3:1 bedside comode   Other:     LOS: 4 days   Rocco Serene, MD [phone] 11/27/2013, 12:08 PM

## 2013-11-27 NOTE — Progress Notes (Addendum)
  Date: 11/27/2013  Patient name: Chase [name]  Medical record number: [id number]  Date of birth: [birth year]   This patient has been seen and the plan of care was discussed with the house staff. Please see their note for complete details. I concur with their findings with the following additions/corrections:  SOB is slightly better this morning. Denies cough or CP. No dizziness. No other falls reported. He was able to discuss care with palliative care, appreciate input. MAP noted to vary between 54-70.  Of note, he is noted to have persistently ~ unchanged thrombocytopenia and elevated PT. Liver U/S without evidence of sonographically cirrhotic liver. No active bleeding noted. H/H has been stable. Given submassive PE and high clot burden, possibilities include consumptive coagulopathy/DIC. I don't think this is HIT.   Appreciate Hematology input into this difficult scenario. He is on heparin gtt with plans to transition to coumadin but has an elevated PT.  Investigating cause with Factor V, VII, and VIII levels in the AM. Will need to decide when to institute coumadin therapy in this case, as he will need anticoagulation. Unfortunately, given a baseline elevated INR it will be difficult to institute coumadin therapy. Similar scenario occurs in liver disease with a baseline elevated INR. He is hypercoagulable given his submassive PE. Agree with possibly treating vit K deficiency given investigation above.  Jonah Blue, DO, FACP Faculty [facility] Internal Medicine Residency Program 11/27/2013, 2:55 PM
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
## 2013-11-27 NOTE — Progress Notes (Signed)
ANTICOAGULATION CONSULT NOTE - Follow Up Consult for Heparin Initial consult for : Coumadin (not started yet due to elevated baseline INR)  Pharmacy Consult for Heparin /Coumadin Indication: pulmonary embolus  No Known Allergies  Patient Measurements: Height: 6\' 3"  (190.5 cm) Weight: 178 lb 12.7 oz (81.1 kg) IBW/kg (Calculated) : 84.5 Heparin dosing weight: 80.6 kg  Vital Signs: Temp: 97.4 F (36.3 C) (11/30 0800) Temp src: Oral (11/30 0800) BP: 85/62 mmHg (11/30 0800) Pulse Rate: 64 (11/30 0800)  Labs:  Recent Labs  11/23/13 2140  11/24/13 0427  11/24/13 1936 11/25/13 0502 11/26/13 0419 11/26/13 0841 11/26/13 1340  HGB  --   < > 12.3*  --   --  12.9* 12.9*  --   --   HCT  --   --  37.8*  --   --  39.6 38.6*  --   --   PLT  --   --  81*  --   --  101* 104*  --   --   LABPROT  --   --  19.7*  --   --   --   --  28.5* 29.6*  INR  --   --  1.72*  --   --   --   --  2.80* 2.94*  HEPARINUNFRC >2.20*  --  0.51  < > 0.50 0.50 0.34  --   --   CREATININE  --   --  1.34  --   --  1.57* 1.63*  --   --   TROPONINI <0.30  --  <0.30  --   --   --   --   --   --   < > = values in this interval not displayed.  Estimated Creatinine Clearance: 47.4 ml/min (by C-G formula based on Cr of 1.63).   Medications:  Heparin 1200 units/hr  Assessment: The heparin level is 0.24 on 1200 units/hr in this 72 y.o male who continues on heparin drip for acute PE with significant clot burden.  Heparin level has been therapeutic on this rate since 11/24/13 until this AM. No problems/issues with the IV line per RN's report. No bleeding noted.   INR 2.61 today. Patient has not received any coumadin yet and he was not on coumadin PTA.  Admit/baseline INR elevated (=1.58)11/23/13.  INR increased up 2.94 yesterday without any coumadin. Cardiologist notes today: Probable DIC with elevated INR and PTT in a patient with submassive PE probably consumption coagulopathy.  Hematologist consulted and notes he  agrees to hold coumadin for now given elevated INR and work up to determine etiology of supratherepuetic INR off coumadin. PLTC = 90 <104K; Thrombocytopenia/ elevated INR likely DIC.  Continuing IV Heparin for submassive PE.     Goal of Therapy:  Heparin level 0.3-0.7 units/ml Monitor platelets by anticoagulation protocol: Yes INR =2-3   Plan:  Increase heparin drip rate to 1400 units/hr.  Heparin level in 6 hours.  -Daily CBC/HL, PT/INR -Monitor for bleeding  -Holding on the start of coumadin due to elevated INR (off coumadin).    Thank you for allowing pharmacy to be a part of this patients care team. Chase [name], RPh Clinical Pharmacist Pager: [phone] 11/27/2013 12:16 PM     Labs:  Recent Labs  11/25/13 0502 11/26/13 0419 11/26/13 0841 11/26/13 1340 11/27/13 0520  HGB 12.9* 12.9*  --   --  12.1*  HCT 39.6 38.6*  --   --  36.1*  PLT 101* 104*  --   --  90*  LABPROT  --   --  28.5* 29.6* 27.0*  INR  --   --  2.80* 2.94* 2.61*  HEPARINUNFRC 0.50 0.34  --   --  0.24*  CREATININE 1.57* 1.63*  --   --  1.53*    Estimated Creatinine Clearance: 50.8 ml/min (by C-G formula based on Cr of 1.53).

## 2013-11-27 NOTE — Progress Notes (Signed)
Chase [name]   DOB:05-May-1941   YN#:[id number]   QMV#:[id number]  Subjective:  Patient denies chest pain or acute shortness of breath.  He is on fall precautions.  He was seen this am by pallative care to discuss code status.   Objective:  Filed Vitals:   11/27/13 0800  BP: 85/62  Pulse: 64  Temp: 97.4 F (36.3 C)  Resp: 17    Body mass index is 22.35 kg/(m^2).  Intake/Output Summary (Last 24 hours) at 11/27/13 1048 Last data filed at 11/27/13 0600  Gross per 24 hour  Intake    672 ml  Output    650 ml  Net     22 ml   MWU:XLKGM, no distress, comfortable and cooperative  And laying in bed SKIN: no rashes or significant lesions, positive for: bruising on upper extremities elbows  (R > L) EYES: PERRLA, Conjunctiva are pink and non-injected, sclera clear  OROPHARYNX:no exudate and lips, buccal mucosa, and tongue normal  NECK: no adenopathy, thyroid normal size, non-tender, without nodularity  LUNGS: decreased breath sounds, no wheezes or rubs  HEART: regular rate & rhythm; + JVD  ABDOMEN:abdomen soft, non-tender, normal bowel sounds and no masses or organomegaly  EXTREMITIES:TEDs on; L leg with bandage on L shin; 1-2+ edema bilaterally. Dry scaly skin. Chronic venous changes. NEURO: alert & oriented x 3 with fluent speech  CBG (last 3)   Recent Labs  11/26/13 1428  GLUCAP 99   Labs:  Lab Results  Component Value Date   WBC 5.0 11/27/2013   HGB 12.1* 11/27/2013   HCT 36.1* 11/27/2013   MCV 104.6* 11/27/2013   PLT 90* 11/27/2013   NEUTROABS 3.0 11/23/2013   Basic Metabolic Panel:  Recent Labs Lab 11/23/13 1514 11/23/13 2140 11/24/13 0427 11/25/13 0502 11/26/13 0419 11/27/13 0520  NA 141  --  142 139 138 138  K 3.9  --  4.5 4.7 5.3* 5.1  CL 99  --  103 100 97 100  CO2 27  --  24 22 19 24   GLUCOSE 84  --  109* 97 54* 107*  BUN 45*  --  45* 49* 58* 61*  CREATININE 1.39*  --  1.34 1.57* 1.63* 1.53*  CALCIUM 9.2  --  8.8 8.8 9.1 8.7  MG  --  2.1  --   --   --    --   PHOS  --  4.0  --   --   --   --    GFR Estimated Creatinine Clearance: 50.8 ml/min (by C-G formula based on Cr of 1.53). Liver Function Tests:  Recent Labs Lab 11/23/13 1514 11/24/13 0427  AST 66* 68*  ALT 69* 71*  ALKPHOS 101 108  BILITOT 2.0* 1.8*  PROT 5.5* 5.0*  ALBUMIN 3.4* 3.1*   Coagulation profile  Recent Labs Lab 11/23/13 1532 11/24/13 0427 11/26/13 0841 11/26/13 1340 11/27/13 0520  INR 1.58* 1.72* 2.80* 2.94* 2.61*   CBC:  Recent Labs Lab 11/23/13 1514 11/24/13 0427 11/25/13 0502 11/26/13 0419 11/27/13 0520  WBC 4.8 4.9 5.8 6.5 5.0  NEUTROABS 3.0  --   --   --   --   HGB 13.0 12.3* 12.9* 12.9* 12.1*  HCT 38.8* 37.8* 39.6 38.6* 36.1*  MCV 103.7* 106.2* 106.2* 106.0* 104.6*  PLT 99* 81* 101* 104* 90*   Cardiac Enzymes:  Recent Labs Lab 11/23/13 1514 11/23/13 2140 11/24/13 0427  TROPONINI <0.30 <0.30 <0.30   CBG:  Recent Labs Lab 11/26/13 1428  GLUCAP  99   Microbiology Recent Results (from the past 240 hour(s))  MRSA PCR SCREENING     Status: None   Collection Time    11/23/13  7:16 PM      Result Value Range Status   MRSA by PCR NEGATIVE  NEGATIVE Final   Comment:            The GeneXpert MRSA Assay (FDA     approved for NASAL specimens     only), is one component of a     comprehensive MRSA colonization     surveillance program. It is not     intended to diagnose MRSA     infection nor to guide or     monitor treatment for     MRSA infections.   Studies:  US Abdomen Complete  11/26/2013   CLINICAL DATA:  72 year old male with abdominal pain and elevated INR.  EXAM: ULTRASOUND ABDOMEN COMPLETE  COMPARISON:  None.  FINDINGS: Gallbladder:  Gallbladder sludge is noted. Moderate gallbladder wall thickening is present which may be related to hepatic dysfunction or ascites, although cholecystitis is not entirely excluded. There is no evidence of cholelithiasis or sonographic Murphy sign.  Common bile duct:  Diameter: 4.2 mm.  There is no evidence of intrahepatic or extrahepatic biliary dilatation.  Liver:  No morphologic evidence of cirrhosis is identified on this study. Hepatic echogenicity is unremarkable. No focal hepatic abnormalities are noted.  IVC:  The IVC is dilated compatible with this patient's right heart strain.  Pancreas:  Visualized portion unremarkable.  Spleen:  Size and appearance within normal limits.  Right Kidney:  Length: 10.8 cm. Echogenicity within normal limits. No mass or hydronephrosis visualized.  Left Kidney:  Length: 10.7 cm. A 2.8 cm cyst is present. Echogenicity within normal limits. No solid mass or hydronephrosis visualized.  Abdominal aorta:  No aneurysm visualized.  Other findings:  Bilateral pleural effusions and small amount of ascites noted.  IMPRESSION: Gallbladder wall thickening and gallbladder sludge. This gallbladder wall thickening is felt to the be related to hepatic dysfunction or ascites. If there is strong clinical suspicion for acute cholecystitis, consider nuclear medicine study.  No morphologic evidence of cirrhosis identified on this study.  Bilateral pleural effusions and small amount of ascites.   Electronically Signed   By: Laveda Abbe M.D.   On: 11/26/2013 20:33    ASSESSMENT: 72 yo male h.o CHF (EF 15%), left cerebellar infarct and hematoma (2003), prior questionable provoked PE, with the following:   1. Submassive PE with increased PA peak pressure  (Risk factors, prior history of VTE, hypertension, ? Immobilization secondary advanced CHF)  -- Agree with heparin gtt; given his second PE, will need A/C indefinitely. LE venous duplex negative. Agree to investigate other etiologies with cancer screening.  --Consider discharge on coumadin as it is reversable in the case of bleeding related to mechanical fall/deconditioning. INR goal between 2-3. Agree to hold coumadin for now given elevated INR.  We will determine etiology of supratherepuetic INR off coumadin.   2. Cancer  screening.  --Likely needs a colonoscopy to rule out malignancy as a contributing cause of #1. It will also determine further GI bleeding risks.  FOBT negative on 11/26/2013.   3.  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  --He does have mildly elevated AST/ALT so liver disease is also in the differential (but could be due to congestion related to #1).  U/S does not demonstrate cirrhosis.  --Severe Vitamin K Defiency could partly explain the  increased INR as well (but usually due to malabsorption).  To distinguish liver disease vs. Vitamin K defiency indirectly, can check Factor V, VII levels (proportional reduction in both factors suggests liver disease, whereas a greater reduction in factor VII than in factor V favors vitamin K deficiency).  Factor VIII is usually low in DIC.  11/27 INR of 1.72 to 11/29 INR of 2.8.  Follow up hepatitis panel.  --Please order a peripheral blood smear, add fibrinogen level, Factor V, VII, and VIII levels in the am.   The patient voices understanding of current disease status and treatment options and is in agreement with the current care plan.   All questions were answered. The patient knows to call the clinic with any problems, questions or concerns. We can certainly see the patient much sooner if necessary.   Thank you so much for allowing me to participate in the care of Chase [name]. I will continue to follow up the patient with you and assist in his care.   I spent 25 minutes counseling the patient face to face. The total time spent in the appointment was 40 minutes.    Phyllistine Domingos, MD 11/27/2013  10:48 AM

## 2013-11-27 NOTE — Consult Note (Signed)
Patient Declined DME ordered.  °

## 2013-11-27 NOTE — Progress Notes (Signed)
ANTICOAGULATION CONSULT NOTE - Follow Up Consult  Pharmacy Consult for Heparin Indication: pulmonary embolus  No Known Allergies  Patient Measurements: Height: 6\' 3"  (190.5 cm) Weight: 178 lb 12.7 oz (81.1 kg) IBW/kg (Calculated) : 84.5 Heparin Dosing Weight: 80.6 kg   Vital Signs: Temp: 97.7 F (36.5 C) (11/30 2001) Temp src: Oral (11/30 2001) BP: 93/67 mmHg (11/30 1600) Pulse Rate: 34 (11/30 1600)  Labs:  Recent Labs  11/25/13 0502 11/26/13 0419 11/26/13 0841 11/26/13 1340 11/27/13 0520 11/27/13 1836  HGB 12.9* 12.9*  --   --  12.1*  --   HCT 39.6 38.6*  --   --  36.1*  --   PLT 101* 104*  --   --  90*  --   LABPROT  --   --  28.5* 29.6* 27.0*  --   INR  --   --  2.80* 2.94* 2.61*  --   HEPARINUNFRC 0.50 0.34  --   --  0.24* 0.35  CREATININE 1.57* 1.63*  --   --  1.53*  --     Estimated Creatinine Clearance: 50.8 ml/min (by C-G formula based on Cr of 1.53).   Assessment: Anticoagulation: On heparin drip for new PE. Heparin level 0.35 in goal range. Thrombocytopenia and elevated PT noted.  Goal of Therapy:  Heparin level 0.3-0.7 units/ml Monitor platelets by anticoagulation protocol: Yes   Plan:  Continue heparin 1400 units/hr Could consider long-term Lovenox.   Janaisha Tolsma S. Merilynn Finland, PharmD, BCPS Clinical Staff Pharmacist Pager [phone]  Misty Stanley Stillinger 11/27/2013,8:02 PM

## 2013-11-27 NOTE — Progress Notes (Signed)
Patient YN:WGNFA GIULIANO [name]      DOB: 05-11-1941      OZH:[id number]  Spoke at length with patients sister.  She understands that tenuous nature of Chase's illness.  She is making arrangements to drive down on Tuesday to help him get his affairs in order and further help him with his goals of care.  Chase Artley L. Ladona Ridgel, MD MBA The Palliative Medicine Team at [facility] Phone: [phone] Pager: [phone]

## 2013-11-28 ENCOUNTER — Other Ambulatory Visit: Payer: Self-pay | Admitting: Oncology

## 2013-11-28 LAB — CBC
HCT: 38.6 % — ABNORMAL LOW (ref 39.0–52.0)
Hemoglobin: 12.7 g/dL — ABNORMAL LOW (ref 13.0–17.0)
MCH: 34.4 pg — ABNORMAL HIGH (ref 26.0–34.0)
MCV: 104.6 fL — ABNORMAL HIGH (ref 78.0–100.0)
Platelets: 83 10*3/uL — ABNORMAL LOW (ref 150–400)
RBC: 3.69 MIL/uL — ABNORMAL LOW (ref 4.22–5.81)
RDW: 17.1 % — ABNORMAL HIGH (ref 11.5–15.5)
WBC: 5.8 10*3/uL (ref 4.0–10.5)

## 2013-11-28 LAB — FACTOR 8 ASSAY: Coagulation Factor VIII: 566 % — ABNORMAL HIGH (ref 73–140)

## 2013-11-28 LAB — BASIC METABOLIC PANEL
BUN: 65 mg/dL — ABNORMAL HIGH (ref 6–23)
CO2: 23 mEq/L (ref 19–32)
Calcium: 8.6 mg/dL (ref 8.4–10.5)
Creatinine, Ser: 1.41 mg/dL — ABNORMAL HIGH (ref 0.50–1.35)
Glucose, Bld: 108 mg/dL — ABNORMAL HIGH (ref 70–99)

## 2013-11-28 LAB — GLUCOSE, CAPILLARY: Glucose-Capillary: 95 mg/dL (ref 70–99)

## 2013-11-28 MED ORDER — ENOXAPARIN SODIUM 80 MG/0.8ML ~~LOC~~ SOLN
1.0000 mg/kg | Freq: Two times a day (BID) | SUBCUTANEOUS | Status: DC
  Administered 2013-11-28 – 2013-11-30 (×4): 80 mg via SUBCUTANEOUS
  Filled 2013-11-28 (×8): qty 0.8

## 2013-11-28 MED ORDER — ENOXAPARIN SODIUM 80 MG/0.8ML ~~LOC~~ SOLN
1.0000 mg/kg | Freq: Two times a day (BID) | SUBCUTANEOUS | Status: DC
  Filled 2013-11-28 (×2): qty 0.8

## 2013-11-28 NOTE — Progress Notes (Signed)
Seen and agree with SPT note Mirabelle Cyphers Tabor Kenzley Ke, PT [phone]  

## 2013-11-28 NOTE — Progress Notes (Signed)
Patient ZO:XWRUE ADRIEL KESSEN      DOB: [birth year]      AVW:[id number]  Received a call from patient's sister [name] is on the way and should arrive late afternoon early evening.  She is willing to meet early am with her brother. I have planned a 9 am meeting Tuesday morning 12/2.  Libbi Towner L. Ladona Ridgel, MD MBA The Palliative Medicine Team at [facility] Phone: [phone] Pager: [phone]

## 2013-11-28 NOTE — Progress Notes (Addendum)
Subjective: No complaints today. Patient says he feels better. SOB present only when he gets worried about his health, talking in full sentences, very communicative. Pts sister will be present later today. To meet with palliative. Pt says its ok to talk with his sister in his absence about his condition.  Objective: Vital signs in last 24 hours: Filed Vitals:   11/28/13 0600 11/28/13 0744 11/28/13 0818 11/28/13 1211  BP: 111/78 109/73  87/63  Pulse: 66 45  75  Temp:   98 F (36.7 C) 97.3 F (36.3 C)  TempSrc:   Oral Oral  Resp: 20 20  22   Height:      Weight:      SpO2: 100% 100%  100%   Weight change:   Intake/Output Summary (Last 24 hours) at 11/28/13 1315 Last data filed at 11/28/13 1200  Gross per 24 hour  Intake 645.43 ml  Output    500 ml  Net 145.43 ml   Physical Exam-  General: Not in any distress, sitting at the side of the bed, on Nasal cannula- O2. HEENT: AT, [place]. Cardiac: Irreg rate , no rubs, murmurs or gallops  Pulm: Clear to auscultation bilat. Abd: soft, nontender, nondistended, BS present  Ext: Right leg appears erthythematous- with scalling,not tender, no calf tenderness, but pt says its been that way for weeks, no recent changes, DP pulses not detectable, but Posterior tibial pulses present- Both checked with and held dopplers. Neuro: alert and oriented X3, cranial nerves II-XII grossly intact  Skin: ecchymoses to skin, spider veins to face- Nose.  Lab Results: Basic Metabolic Panel:  Recent Labs Lab 11/23/13 1514 11/23/13 2140  11/27/13 0520 11/28/13 0450  NA 141  --   < > 138 137  K 3.9  --   < > 5.1 4.7  CL 99  --   < > 100 102  CO2 27  --   < > 24 23  GLUCOSE 84  --   < > 107* 108*  BUN 45*  --   < > 61* 65*  CREATININE 1.39*  --   < > 1.53* 1.41*  CALCIUM 9.2  --   < > 8.7 8.6  MG  --  2.1  --   --   --   PHOS  --  4.0  --   --   --   < > = values in this interval not displayed. Liver Function Tests:  Recent Labs Lab  11/23/13 1514 11/24/13 0427  AST 66* 68*  ALT 69* 71*  ALKPHOS 101 108  BILITOT 2.0* 1.8*  PROT 5.5* 5.0*  ALBUMIN 3.4* 3.1*   No results found for this basename: LIPASE, AMYLASE,  in the last 168 hours No results found for this basename: AMMONIA,  in the last 168 hours CBC:  Recent Labs Lab 11/23/13 1514  11/27/13 0520 11/28/13 0450  WBC 4.8  < > 5.0 5.8  NEUTROABS 3.0  --   --   --   HGB 13.0  < > 12.1* 12.7*  HCT 38.8*  < > 36.1* 38.6*  MCV 103.7*  < > 104.6* 104.6*  PLT 99*  < > 90* 83*  < > = values in this interval not displayed. Cardiac Enzymes:  Recent Labs Lab 11/23/13 1514 11/23/13 2140 11/24/13 0427  TROPONINI <0.30 <0.30 <0.30   BNP:  Recent Labs Lab 11/23/13 1514  PROBNP 37244.0*   D-Dimer: No results found for this basename: DDIMER,  in the last 168  hours CBG:  Recent Labs Lab 11/26/13 1428 11/28/13 0820  GLUCAP 99 95   Coagulation:  Recent Labs Lab 11/26/13 0841 11/26/13 1340 11/27/13 0520 11/28/13 0450  LABPROT 28.5* 29.6* 27.0* 25.0*  INR 2.80* 2.94* 2.61* 2.36*   Micro Results: Recent Results (from the past 240 hour(s))  MRSA PCR SCREENING     Status: None   Collection Time    11/23/13  7:16 PM      Result Value Range Status   MRSA by PCR NEGATIVE  NEGATIVE Final   Comment:            The GeneXpert MRSA Assay (FDA     approved for NASAL specimens     only), is one component of a     comprehensive MRSA colonization     surveillance program. It is not     intended to diagnose MRSA     infection nor to guide or     monitor treatment for     MRSA infections.   Studies/Results: US Abdomen Complete  11/26/2013   CLINICAL DATA:  72 year old male with abdominal pain and elevated INR.  EXAM: ULTRASOUND ABDOMEN COMPLETE  COMPARISON:  None.  FINDINGS: Gallbladder:  Gallbladder sludge is noted. Moderate gallbladder wall thickening is present which may be related to hepatic dysfunction or ascites, although cholecystitis is not  entirely excluded. There is no evidence of cholelithiasis or sonographic Murphy sign.  Common bile duct:  Diameter: 4.2 mm. There is no evidence of intrahepatic or extrahepatic biliary dilatation.  Liver:  No morphologic evidence of cirrhosis is identified on this study. Hepatic echogenicity is unremarkable. No focal hepatic abnormalities are noted.  IVC:  The IVC is dilated compatible with this patient's right heart strain.  Pancreas:  Visualized portion unremarkable.  Spleen:  Size and appearance within normal limits.  Right Kidney:  Length: 10.8 cm. Echogenicity within normal limits. No mass or hydronephrosis visualized.  Left Kidney:  Length: 10.7 cm. A 2.8 cm cyst is present. Echogenicity within normal limits. No solid mass or hydronephrosis visualized.  Abdominal aorta:  No aneurysm visualized.  Other findings:  Bilateral pleural effusions and small amount of ascites noted.  IMPRESSION: Gallbladder wall thickening and gallbladder sludge. This gallbladder wall thickening is felt to the be related to hepatic dysfunction or ascites. If there is strong clinical suspicion for acute cholecystitis, consider nuclear medicine study.  No morphologic evidence of cirrhosis identified on this study.  Bilateral pleural effusions and small amount of ascites.   Electronically Signed   By: Laveda Abbe M.D.   On: 11/26/2013 20:33   Medications: I have reviewed the patient's current medications. Scheduled Meds: . coumadin book   Does not apply Once  . digoxin  0.0625 mg Oral Daily  . feeding supplement (ENSURE COMPLETE)  237 mL Oral TID BM  . hydrocortisone cream   Topical BID  . warfarin   Does not apply Once  . Warfarin - Pharmacist Dosing Inpatient   Does not apply q1800   Continuous Infusions: . sodium chloride 5 mL/hr at 11/28/13 0700  . heparin 1,400 Units/hr (11/28/13 0700)   PRN Meds:.albuterol, dextrose, docusate sodium, polyethylene glycol Assessment/Plan:  #Pulmonary embolus- Large PE revealed on CT  involving the Rt pulm art.  BLE Dopplers negative for DVT- 11/24/2013. He has reportedly not had a malignancy w/u i.e. Colonoscopy, will try to obtain records from [facility] today, and could consider CT abd/pelvis in future. Currently on Heparin gtt, not on coumadin given elevated  INR- 2.36 today, though down from 2.94 two days ago.  Considerations were for Liver dx Vs Protein energy malnutrition with Vit K defc. But Uss of liver- No evid of cirrhosis.  -Hem-onc consult (Dr. Rosie Fate), appreciate recs; Fibrinogen within normal limits. PBS done- awaiting review, Factor 5, 7, 8 levels pending.  -Continue Heparin gtt,  Pharm recommendations are for starting Lovenox for long term anticoag, considering renal function will order Lovenox as per Pharmacy. -Palliative consult, appreciate recs; patient remains full code at this time.  #Chronic systolic heart failure, ?diastolic- EF 15% this admission. Per pt last EF was 48% but no records.  -Continue Digoxin 0.625 mg daily per cards recs, poor prognosis  -Heart failure team consulted as well   #Thrombocytopenia- Unknown baseline, platelets 83 today (down from 104 2 days ago). ?etiology related to chronic illness or underlying liver disease. Unlikely to be HIT. Hem-onc requested add-on fibrinogen level and will look at peripheral smear to evaluate for DIC. Abdominal ultrasound yesterday showed no evidence of cirrhosis. Of note, patient does have elevated MCV with low Hgb thus could evaluate folate and B12 deficiency in outpatient setting.  -daily CBCs   #Elevated INR- Possible etiologies- consumptive coagulopathy; also possible that patient has low vitamin K intake through diet which could be contributing. Abdominal ultrasound showed gallbladder wall thickening, no evidence of cirrhosis with small amount of ascites. Hepatitis panel pending.  -INR in AM, follow-up hepatitis panel   #Elevated serum creatinine- Baseline Cr unknown. Cr 1.5 today, down from 1.6  yesterday.  -continue to monitor, no IVFs for now  #Protein-calorie malnutrition, severe- Patient having early satiety and no appetite which may be due to chronic illness but again, malignancy workup is warranted. Also possible that vitamin K intake low thereby contributing to elevated INR.  -Nutrition consulted   #F/E/N  -NSL  -BMP in AM  -Will commence full regular diet.  #DVT PPX  -Heparin gtt    Dispo: Disposition is deferred at this time, awaiting improvement of current medical problems.    The patient does have a current PCP Maryelizabeth Rowan MD ) and does not need an [facility] hospital follow-up appointment after discharge.  The patient does not know have transportation limitations that hinder transportation to clinic appointments.  .Services Needed at time of discharge: Y = Yes, Blank = No PT:   OT:   RN:   Equipment:   Other:     LOS: 5 days   Kennis Carina, MD 11/28/2013, 1:15 PM

## 2013-11-28 NOTE — Progress Notes (Signed)
Physical Therapy Treatment Patient Details Name: Chase [name] MRN: [id number] DOB: 27-Nov-1941 Today's Date: 11/28/2013 Time: 1413-1430 PT Time Calculation (min): 17 min  PT Assessment / Plan / Recommendation  History of Present Illness 2 M with hx of PE 65yrs ago after LE trauma admitted with 3 wks of increasing LE edema and DOE. CTA chest revealed large PE. PCCM asked to see due to mild hypotension   PT Comments   Patient reports he sat up in the chair for 4+ hours this morning and that transfers to/from the bed to chair wore him out. Only agreed to exercise in the bed with the aim to increase circulation and strength in LE. Needed multiple cues to pause his speaking and take deeper breaths throughout session.    Follow Up Recommendations  SNF;Supervision for mobility/OOB     Does the patient have the potential to tolerate intense rehabilitation     Barriers to Discharge        Equipment Recommendations  None recommended by PT    Recommendations for Other Services    Frequency Min 3X/week   Progress towards PT Goals    Plan Current plan remains appropriate    Precautions / Restrictions Precautions Precautions: Fall Restrictions Weight Bearing Restrictions: No   Pertinent Vitals/Pain SpO2 100% on 1L at rest, dropped to 88% with activity and pt talking rapidly without pause. 97-100% with activity and no talking.    Mobility  Bed Mobility Bed Mobility: Not assessed Transfers Transfers: Not assessed Ambulation/Gait Ambulation/Gait Assistance: Not tested (comment)    Exercises General Exercises - Lower Extremity Ankle Circles/Pumps: AROM;Both;20 reps Heel Slides: AROM;Both;20 reps Hip ABduction/ADduction: AROM;Both;20 reps   PT Diagnosis:    PT Problem List:   PT Treatment Interventions:     PT Goals (current goals can now be found in the care plan section)    Visit Information  Last PT Received On: 11/28/13 Assistance Needed: +1 History of Present Illness:  30 M with hx of PE 60yrs ago after LE trauma admitted with 3 wks of increasing LE edema and DOE. CTA chest revealed large PE. PCCM asked to see due to mild hypotension    Subjective Data      Cognition  Cognition Arousal/Alertness: Awake/alert Behavior During Therapy: Anxious (reports fear his situation will not improve) Overall Cognitive Status: Impaired/Different from baseline Area of Impairment: Attention;Safety/judgement Current Attention Level: Sustained General Comments: pt argumentative, predicting his condition will worsen with any activity; perseverating about sister's arrival; illogical circular conversation about his condition and it's severity    Balance     End of Session PT - End of Session Patient left: in bed;with call bell/phone within reach;with family/visitor present Nurse Communication: Mobility status   GP     Willette Pa, SPT 11/28/2013, 3:05 PM

## 2013-11-28 NOTE — Progress Notes (Signed)
Clinical Social Work Department CLINICAL SOCIAL WORK PLACEMENT NOTE 11/28/2013  Patient:  Chase [name], Chase [name]  Account Number:  [id number] Admit date:  11/23/2013  Clinical Social Worker:  Maryclare Labrador, Theresia Majors  Date/time:  11/28/2013 02:21 PM  Clinical Social Work is seeking post-discharge placement for this patient at the following level of care:   SKILLED NURSING   (*CSW will update this form in Epic as items are completed)   11/28/2013  Patient/family provided with [facility] Department of Clinical Social Work's list of facilities offering this level of care within the geographic area requested by the patient (or if unable, by the patient's family).  11/28/2013  Patient/family informed of their freedom to choose among providers that offer the needed level of care, that participate in Medicare, Medicaid or managed care program needed by the patient, have an available bed and are willing to accept the patient.  11/28/2013  Patient/family informed of [facility]' ownership interest in [facility], as well as of the fact that they are under no obligation to receive care at this facility.  PASARR submitted to EDS on 11/28/2013 PASARR number received from EDS on 11/28/2013  FL2 transmitted to all facilities in geographic area requested by pt/family on  11/28/2013 FL2 transmitted to all facilities within larger geographic area on   Patient informed that his/her managed care company has contracts with or will negotiate with  certain facilities, including the following:     Patient/family informed of bed offers received:   Patient chooses bed at  Physician recommends and patient chooses bed at    Patient to be transferred to  on   Patient to be transferred to facility by   The following physician request were entered in Epic:   Additional Comments:    Maryclare Labrador, MSW, [facility] Clinical Social Worker [phone]

## 2013-11-28 NOTE — Progress Notes (Signed)
Clinical Social Work Department BRIEF PSYCHOSOCIAL ASSESSMENT 11/28/2013  Patient:  Chase [name], Chase [name]     Account Number:  [id number]     Admit date:  11/23/2013  Clinical Social Worker:  Chase [name]  Date/Time:  11/28/2013 02:10 PM  Referred by:  Physician  Date Referred:  11/28/2013 Referred for  SNF Placement   Other Referral:   Interview type:  Patient Other interview type:    PSYCHOSOCIAL DATA Living Status:  ALONE Admitted from facility:   Level of care:   Primary support name:  Chase [name] [phone]) Primary support relationship to patient:  SIBLING Degree of support available:   Good--pt has supportive neighbor, who drives him to MD appointments and has sister Chase [name] who lives in [place] and is traveling to visit pt tonight.    CURRENT CONCERNS Current Concerns  Post-Acute Placement   Other Concerns:    SOCIAL WORK ASSESSMENT / PLAN CSW introduced self and engaged pt in life review. Pt told stories about serving in the [employer] and growing up in [place]; pt says he has lived all over the world and recited some states/countries he has lived in. CSW asked pt what brought him to the hospital, and pt explained that he has blood clots in his lungs. Pt says he came to the hospital about 13 years ago and was in critical condition, but that he recovered. Pt explains that he know his condition is worse at this point and that he will die. CSW provided support and asked how he imagines spending his time when he gets closer to death. Pt expressed that he is scared, and that he is not sure what decision he would like to make. Pt states if he has to go to a nursing home he would like [facility]. CSW has sent clinicals to this facility. CSW asked pt about advanced directives, as pt says he does not want to live if he is a "vegetable" and unable to function. However, pt does want to have medical intervention if it is deemed a "problem that can be fixed" (for example, a wound that could  be treated to stop the bleeding). Pt says he will have conversations with his sister about the medical intervention he would like to have happen if he is unable to make decisions and his sister could step in to make these decisions. CSW provided a list of nursing homes but explained that pt does not have to make any decisions on placement at this time and that he also has home health as an option or hospice services. Pt understanding of his options and will have a conversation with his sister before deciding on placement.   Assessment/plan status:  Psychosocial Support/Ongoing Assessment of Needs Other assessment/ plan:   Information/referral to community resources:   SNF--provided SNF list, pt's first chioce is [facility]. Clinicals sent to facility. Pt unsure where he would like to go or how he would like to spend the next portion of his life.    PATIENT'S/FAMILY'S RESPONSE TO PLAN OF CARE: Pt receptive to CSW visit and talked at length about his childhood and young adulthood. Pt expressed that he is afraid of death, and CSW talked with him about this fear at length. Pt unsure about his decision in light of his illness and impending death. CSW provided support and had open conversation with pt about his options. Pt will talk with his sister, who is coming to town tonight, about how he would like to proceed with his medical care.  Pt expressed gratitude for CSW visit. CSW will visit pt again tomorrow.      Chase [name], MSW, [facility] Clinical Social Worker [phone]

## 2013-11-28 NOTE — Progress Notes (Signed)
Chase [name]   DOB:[birth year]   ZO#:[id number]   JWJ#:[id number]  Subjective:  Patient denies chest pain or acute shortness of breath.  He is on fall precautions.  His sister Chase [name] is at bedside.  Patient received first lovenox injection SQ.   Objective:  Filed Vitals:   11/28/13 1630  BP: 100/79  Pulse: 86  Temp: 97.5 F (36.4 C)  Resp:     Body mass index is 22.35 kg/(m^2).  Intake/Output Summary (Last 24 hours) at 11/28/13 1739 Last data filed at 11/28/13 1700  Gross per 24 hour  Intake 1043.43 ml  Output    650 ml  Net 393.43 ml   AOZ:HYQMV, no distress, comfortable and cooperative  And laying in bed SKIN: no rashes or significant lesions, positive for: bruising on upper extremities elbows  (R > L) EYES: PERRLA, Conjunctiva are pink and non-injected, sclera clear  OROPHARYNX:no exudate and lips, buccal mucosa, and tongue normal  NECK: no adenopathy, thyroid normal size, non-tender, without nodularity  LUNGS: decreased breath sounds, no wheezes or rubs  HEART: regular rate & rhythm ABDOMEN:abdomen soft, non-tender, normal bowel sounds and no masses or organomegaly  EXTREMITIES:TEDs on; L leg with bandage on L shin; 1+ edema bilaterally. Dry scaly skin. Chronic venous changes. NEURO: alert & oriented x 3 with fluent speech  CBG (last 3)   Recent Labs  11/26/13 1428 11/28/13 0820  GLUCAP 99 95   Labs:  Lab Results  Component Value Date   WBC 5.8 11/28/2013   HGB 12.7* 11/28/2013   HCT 38.6* 11/28/2013   MCV 104.6* 11/28/2013   PLT 83* 11/28/2013   NEUTROABS 3.0 11/23/2013   Basic Metabolic Panel:  Recent Labs Lab 11/23/13 1514 11/23/13 2140 11/24/13 0427 11/25/13 0502 11/26/13 0419 11/27/13 0520 11/28/13 0450  NA 141  --  142 139 138 138 137  K 3.9  --  4.5 4.7 5.3* 5.1 4.7  CL 99  --  103 100 97 100 102  CO2 27  --  24 22 19 24 23   GLUCOSE 84  --  109* 97 54* 107* 108*  BUN 45*  --  45* 49* 58* 61* 65*  CREATININE 1.39*  --  1.34 1.57* 1.63* 1.53*  1.41*  CALCIUM 9.2  --  8.8 8.8 9.1 8.7 8.6  MG  --  2.1  --   --   --   --   --   PHOS  --  4.0  --   --   --   --   --    GFR Estimated Creatinine Clearance: 55.1 ml/min (by C-G formula based on Cr of 1.41). Liver Function Tests:  Recent Labs Lab 11/23/13 1514 11/24/13 0427  AST 66* 68*  ALT 69* 71*  ALKPHOS 101 108  BILITOT 2.0* 1.8*  PROT 5.5* 5.0*  ALBUMIN 3.4* 3.1*   Coagulation profile  Recent Labs Lab 11/24/13 0427 11/26/13 0841 11/26/13 1340 11/27/13 0520 11/28/13 0450  INR 1.72* 2.80* 2.94* 2.61* 2.36*   CBC:  Recent Labs Lab 11/23/13 1514 11/24/13 0427 11/25/13 0502 11/26/13 0419 11/27/13 0520 11/28/13 0450  WBC 4.8 4.9 5.8 6.5 5.0 5.8  NEUTROABS 3.0  --   --   --   --   --   HGB 13.0 12.3* 12.9* 12.9* 12.1* 12.7*  HCT 38.8* 37.8* 39.6 38.6* 36.1* 38.6*  MCV 103.7* 106.2* 106.2* 106.0* 104.6* 104.6*  PLT 99* 81* 101* 104* 90* 83*   Cardiac Enzymes:  Recent Labs Lab 11/23/13 1514  11/23/13 2140 11/24/13 0427  TROPONINI <0.30 <0.30 <0.30   CBG:  Recent Labs Lab 11/26/13 1428 11/28/13 0820  GLUCAP 99 95   Microbiology Recent Results (from the past 240 hour(s))  MRSA PCR SCREENING     Status: None   Collection Time    11/23/13  7:16 PM      Result Value Range Status   MRSA by PCR NEGATIVE  NEGATIVE Final   Comment:            The GeneXpert MRSA Assay (FDA     approved for NASAL specimens     only), is one component of a     comprehensive MRSA colonization     surveillance program. It is not     intended to diagnose MRSA     infection nor to guide or     monitor treatment for     MRSA infections.   Studies:  US Abdomen Complete  11/26/2013   CLINICAL DATA:  73 year old male with abdominal pain and elevated INR.  EXAM: ULTRASOUND ABDOMEN COMPLETE  COMPARISON:  None.  FINDINGS: Gallbladder:  Gallbladder sludge is noted. Moderate gallbladder wall thickening is present which may be related to hepatic dysfunction or ascites,  although cholecystitis is not entirely excluded. There is no evidence of cholelithiasis or sonographic Murphy sign.  Common bile duct:  Diameter: 4.2 mm. There is no evidence of intrahepatic or extrahepatic biliary dilatation.  Liver:  No morphologic evidence of cirrhosis is identified on this study. Hepatic echogenicity is unremarkable. No focal hepatic abnormalities are noted.  IVC:  The IVC is dilated compatible with this patient's right heart strain.  Pancreas:  Visualized portion unremarkable.  Spleen:  Size and appearance within normal limits.  Right Kidney:  Length: 10.8 cm. Echogenicity within normal limits. No mass or hydronephrosis visualized.  Left Kidney:  Length: 10.7 cm. A 2.8 cm cyst is present. Echogenicity within normal limits. No solid mass or hydronephrosis visualized.  Abdominal aorta:  No aneurysm visualized.  Other findings:  Bilateral pleural effusions and small amount of ascites noted.  IMPRESSION: Gallbladder wall thickening and gallbladder sludge. This gallbladder wall thickening is felt to the be related to hepatic dysfunction or ascites. If there is strong clinical suspicion for acute cholecystitis, consider nuclear medicine study.  No morphologic evidence of cirrhosis identified on this study.  Bilateral pleural effusions and small amount of ascites.   Electronically Signed   By: Laveda Abbe M.D.   On: 11/26/2013 20:33   Results for Chase [name], Chase [name] (MRN [id number]) as of 11/28/2013 17:40  Ref. Range 11/27/2013 05:20  Hep A IgM Latest Range: NON REACTIVE  NON REACTIVE  Hepatitis B Surface Ag Latest Range: NEGATIVE  NEGATIVE  Hep B C IgM Latest Range: NON REACTIVE  NON REACTIVE  HCV Ab Latest Range: NEGATIVE  NEGATIVE    Results for Chase [name], Chase [name] (MRN [id number]) as of 11/28/2013 17:40  Ref. Range 11/27/2013 12:52  Fibrinogen Latest Range: 204-475 mg/dL 782   Results for Chase [name], Chase [name] (MRN [id number]) as of 11/28/2013 17:40  Ref. Range 11/28/2013 04:50  Coagulation Factor VIII  Latest Range: 73-140 % 566 (H)   ASSESSMENT: 72 yo male h.o CHF (EF 15%), left cerebellar infarct and hematoma (2003), prior questionable provoked PE, with the following:   1. Submassive PE with increased PA peak pressure  (Risk factors, prior history of VTE, hypertension, ? Immobilization secondary advanced CHF)  -- Agree with lovenox renally dosed given his elevated PT; given his  second PE, will need A/C indefinitely. LE venous duplex negative. Agree to investigate other etiologies with cancer screening.  --Etiology of elevated as noted below.  --Patient will require local follow-up.  He reports he lives nearly 20 miles away and will be unable to follow-up at [facility] center.  We will help establish transition for his care locally depending on level of his aggressiveness regarding his palliative care discussions.  --Await palliative care recommendations and further discussions tomorrow.   2. Cancer screening.  --Likely needs a colonoscopy to rule out malignancy as a contributing cause of #1. It will also determine further GI bleeding risks.  FOBT negative on 11/26/2013.   3.  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  --He does have mildly elevated AST/ALT so liver disease is also in the differential (but could be due to congestion related to #1).  U/S does not demonstrate cirrhosis.  Hepatitis acute panel was negative.  --Please give 2.5-5 mg oral vitamin K prior to discharge.   --Peripheral blood smear showed rare schizocytes, occasional acathocytes and target cells and decreased plts.  Otherwise, unremarkable. Fibrinogen was low normal; Factor VII level 566.   Will follow up factor V and VII levels but we think he is likely vitamin K deficient.   --Please considering holding anticoagulation if plts less than 50 K.   He will require frequent CBC (2 times a week) given his low plts; BMP weekly given his elevated creatinine on lovenox. Not sure if his plts range will improve without  treating the primary etiology of his DIC which may in part be explained by his cardiac function.   The patient voices understanding of current disease status and treatment options and is in agreement with the current care plan.   All questions were answered. The patient knows to call the clinic with any problems, questions or concerns. We can certainly see the patient much sooner if necessary.   Thank you so much for allowing me to participate in the care of Chase [name]. I will continue to follow up the patient with you and assist in his care.   I spent 15 minutes counseling the patient face to face. The total time spent in the appointment was 25 minutes.  Taviana Westergren, MD 11/28/2013  5:39 PM

## 2013-11-28 NOTE — Progress Notes (Addendum)
ANTICOAGULATION CONSULT NOTE - initial consult for lovenox Previously: Follow Up Consult for Heparin Initial consult for : Coumadin (not started yet due to elevated baseline INR)  Pharmacy Consult for Heparin /Coumadin Indication: pulmonary embolus  No Known Allergies  Patient Measurements: Height: 6\' 3"  (190.5 cm) Weight: 178 lb 12.7 oz (81.1 kg) IBW/kg (Calculated) : 84.5 Heparin dosing weight: 80.6 kg  Vital Signs: Temp: 98 F (36.7 C) (12/01 0818) Temp src: Oral (12/01 0818) BP: 109/73 mmHg (12/01 0744) Pulse Rate: 45 (12/01 0744)  Labs:  Recent Labs  11/23/13 2140  11/24/13 0427  11/24/13 1936 11/25/13 0502 11/26/13 0419 11/26/13 0841 11/26/13 1340  HGB  --   < > 12.3*  --   --  12.9* 12.9*  --   --   HCT  --   --  37.8*  --   --  39.6 38.6*  --   --   PLT  --   --  81*  --   --  101* 104*  --   --   LABPROT  --   --  19.7*  --   --   --   --  28.5* 29.6*  INR  --   --  1.72*  --   --   --   --  2.80* 2.94*  HEPARINUNFRC >2.20*  --  0.51  < > 0.50 0.50 0.34  --   --   CREATININE  --   --  1.34  --   --  1.57* 1.63*  --   --   TROPONINI <0.30  --  <0.30  --   --   --   --   --   --   < > = values in this interval not displayed.  Estimated Creatinine Clearance: 47.4 ml/min (by C-G formula based on Cr of 1.63).   Labs:  Recent Labs  11/26/13 0419  11/26/13 1340 11/27/13 0520 11/27/13 1836 11/28/13 0450  HGB 12.9*  --   --  12.1*  --  12.7*  HCT 38.6*  --   --  36.1*  --  38.6*  PLT 104*  --   --  90*  --  83*  LABPROT  --   < > 29.6* 27.0*  --  25.0*  INR  --   < > 2.94* 2.61*  --  2.36*  HEPARINUNFRC 0.34  --   --  0.24* 0.35 0.42  CREATININE 1.63*  --   --  1.53*  --  1.41*  < > = values in this interval not displayed.  Estimated Creatinine Clearance: 55.1 ml/min (by C-G formula based on Cr of 1.41).  Medications:  Heparin 1400 units/hr  Assessment: PH is a 72 yo M who continues on heparin drip for acute PE with significant clot burden. The  heparin level this morning is therapeutic at 0.42 on 1400 units/hr.  No bleeding issues noted.   INR is trending down to 2.36 today. Patient has not received any coumadin yet and he was not on coumadin PTA.  Admit/baseline INR elevated (=1.58)11/23/13.  INR increased to 2.94 on 11/29 without any coumadin. Cardiologist noted probable DIC with elevated INR and PTT in a patient with submassive PE probably consumption coagulopathy.  Hematologist consulted and noted he agrees to hold coumadin for now given elevated INR and work up to determine etiology of supratherepuetic INR off coumadin.   PLTC remains low; thrombocytopenia/ elevated INR likely DIC.   Continuing IV Heparin for submassive PE.  Goal of Therapy:  Heparin level 0.3-0.7 units/ml Monitor platelets by anticoagulation protocol: Yes INR =2-3   Plan:  Continue heparin drip rate at 1400 units/hr.   -Daily CBC/HL, PT/INR -Monitor for bleeding  -Holding on the start of coumadin due to elevated INR (off coumadin).   -F/U coagulation plans  Oney Tatlock C. Jess Toney, PharmD Clinical Pharmacist-Resident Pager: [phone] Pharmacy: [phone] 11/28/2013 10:11 AM  Addendum: Heparin to be discontinued and therapeutic lovenox to start. Pharmacy has been consulted to dose lovenox.  Per patient's renal function, lovenox will be dosed at 1 mg/kg subcutaneously q 12 hours, scheduled to start 1 hour from discontinuing the heparin drip.   Maryjane Benedict C. Alejandro Adcox, PharmD Clinical Pharmacist-Resident Pager: [phone] Pharmacy: [phone] 11/28/2013 2:07 PM

## 2013-11-28 NOTE — Progress Notes (Signed)
  Date: 11/28/2013  Patient name: Chase [name]  Medical record number: [id number]  Date of birth: [birth year]   This patient has been seen and the plan of care was discussed with the house staff. Please see their note for complete details. I concur with their findings with the following additions/corrections: MR Wymore was sitting in recliner. He was able to speak in full sentences and indicated that he was feeling a bit better. We informed him that we would be changing anticoag to Lovenox and he is familiar with this med as it was used last VTE although he is concerned about how he will obtain it. We briefly discussed SNF and he is agreeable if needed once stable for D/C. His sister is arriving tonight got GOC discussion. He needs to remain in the step down unit 2/2 hypotension in setting of submassive PE.   Burns Spain, MD 11/28/2013, 1:06 PM

## 2013-11-29 ENCOUNTER — Inpatient Hospital Stay (HOSPITAL_COMMUNITY): Payer: Medicare Other

## 2013-11-29 DIAGNOSIS — E43 Unspecified severe protein-calorie malnutrition: Secondary | ICD-10-CM

## 2013-11-29 DIAGNOSIS — R05 Cough: Secondary | ICD-10-CM

## 2013-11-29 LAB — BASIC METABOLIC PANEL
BUN: 66 mg/dL — ABNORMAL HIGH (ref 6–23)
CO2: 20 mEq/L (ref 19–32)
Chloride: 101 mEq/L (ref 96–112)
Creatinine, Ser: 1.36 mg/dL — ABNORMAL HIGH (ref 0.50–1.35)
GFR calc non Af Amer: 51 mL/min — ABNORMAL LOW (ref >90)
Glucose, Bld: 96 mg/dL (ref 70–99)
Potassium: 5.2 mEq/L — ABNORMAL HIGH (ref 3.5–5.1)
Sodium: 139 mEq/L (ref 135–145)

## 2013-11-29 LAB — CBC
Hemoglobin: 12.5 g/dL — ABNORMAL LOW (ref 13.0–17.0)
MCHC: 32.9 g/dL (ref 30.0–36.0)
MCV: 105.3 fL — ABNORMAL HIGH (ref 78.0–100.0)
RBC: 3.61 MIL/uL — ABNORMAL LOW (ref 4.22–5.81)
WBC: 5.5 10*3/uL (ref 4.0–10.5)

## 2013-11-29 LAB — PROTIME-INR: INR: 2.03 — ABNORMAL HIGH (ref 0.00–1.49)

## 2013-11-29 MED ORDER — BOOST / RESOURCE BREEZE PO LIQD: 1.0000 | Freq: Three times a day (TID) | ORAL | Status: DC

## 2013-11-29 MED ORDER — BOOST / RESOURCE BREEZE PO LIQD
1.0000 | ORAL | Status: DC
  Administered 2013-11-29: 1 via ORAL

## 2013-11-29 MED ORDER — MIRTAZAPINE 7.5 MG PO TABS
7.5000 mg | ORAL_TABLET | Freq: Every day | ORAL | Status: DC
  Administered 2013-11-29: 7.5 mg via ORAL
  Filled 2013-11-29 (×3): qty 1

## 2013-11-29 NOTE — Progress Notes (Signed)
Subjective: Patient feels better, but complaints of dyspnea on exertion. Also complaints of poor appetite. Lying in bed comfortably. Able to complete long sentenses without feeling dyspneic. Sister and friend present at  Bedside.  Objective: Vital signs in last 24 hours: Filed Vitals:   11/29/13 0453 11/29/13 0753 11/29/13 0755 11/29/13 1358  BP:   100/78 104/71  Pulse:    93  Temp:  97.7 F (36.5 C)  97.8 F (36.6 C)  TempSrc:  Oral  Oral  Resp:   19 18  Height:      Weight: 178 lb 12.7 oz (81.1 kg)     SpO2:   100% 99%   Weight change:   Intake/Output Summary (Last 24 hours) at 11/29/13 2002 Last data filed at 11/29/13 1300  Gross per 24 hour  Intake    360 ml  Output    300 ml  Net     60 ml   Physical Exam-  General: Not in any distress, sitting up in bed, on Nasal cannula- O2. HEENT: AT, [place]. Cardiac: Irreg rate , no rubs, murmurs or gallops  Pulm: Clear to auscultation bilat. Abd: soft, nontender, nondistended, BS present  Ext: Right leg appears erthythematous- with scalling,not tender, no calf tenderness, but pt says its been that way for weeks, no recent changes, DP pulses not palpable. Neuro: alert and oriented X3, cranial nerves II-XII grossly intact  Skin: spider veins on his Nose.  Lab Results: Basic Metabolic Panel:  Recent Labs Lab 11/23/13 1514 11/23/13 2140  11/28/13 0450 11/29/13 0415  NA 141  --   < > 137 139  K 3.9  --   < > 4.7 5.2*  CL 99  --   < > 102 101  CO2 27  --   < > 23 20  GLUCOSE 84  --   < > 108* 96  BUN 45*  --   < > 65* 66*  CREATININE 1.39*  --   < > 1.41* 1.36*  CALCIUM 9.2  --   < > 8.6 8.6  MG  --  2.1  --   --   --   PHOS  --  4.0  --   --   --   < > = values in this interval not displayed. Liver Function Tests:  Recent Labs Lab 11/23/13 1514 11/24/13 0427  AST 66* 68*  ALT 69* 71*  ALKPHOS 101 108  BILITOT 2.0* 1.8*  PROT 5.5* 5.0*  ALBUMIN 3.4* 3.1*   CBC:  Recent Labs Lab 11/23/13 1514   11/28/13 0450 11/29/13 0415  WBC 4.8  < > 5.8 5.5  NEUTROABS 3.0  --   --   --   HGB 13.0  < > 12.7* 12.5*  HCT 38.8*  < > 38.6* 38.0*  MCV 103.7*  < > 104.6* 105.3*  PLT 99*  < > 83* 76*  < > = values in this interval not displayed. Cardiac Enzymes:  Recent Labs Lab 11/23/13 1514 11/23/13 2140 11/24/13 0427  TROPONINI <0.30 <0.30 <0.30   BNP:  Recent Labs Lab 11/23/13 1514  PROBNP 37244.0*   D-Dimer: No results found for this basename: DDIMER,  in the last 168 hours CBG:  Recent Labs Lab 11/26/13 1428 11/28/13 0820  GLUCAP 99 95   Coagulation:  Recent Labs Lab 11/26/13 1340 11/27/13 0520 11/28/13 0450 11/29/13 0415  LABPROT 29.6* 27.0* 25.0* 22.3*  INR 2.94* 2.61* 2.36* 2.03*   Micro Results: Recent Results (from the  past 240 hour(s))  MRSA PCR SCREENING     Status: None   Collection Time    11/23/13  7:16 PM      Result Value Range Status   MRSA by PCR NEGATIVE  NEGATIVE Final   Comment:            The GeneXpert MRSA Assay (FDA     approved for NASAL specimens     only), is one component of a     comprehensive MRSA colonization     surveillance program. It is not     intended to diagnose MRSA     infection nor to guide or     monitor treatment for     MRSA infections.   Studies/Results: Dg Chest 2 View  11/29/2013   CLINICAL DATA:  Cough  EXAM: CHEST  2 VIEW  COMPARISON:  Portable chest x-ray of 11/23/2013  FINDINGS: There is moderate cardiomegaly present with small effusions suggesting mild congestive heart failure. There may be minimal pulmonary vascular congestion present. No acute bony abnormality is seen.  IMPRESSION: Cardiomegaly and small effusions may indicate mild CHF. No definite pneumonia is seen.   Electronically Signed   By: Dwyane Dee M.D.   On: 11/29/2013 17:06   Medications: I have reviewed the patient's current medications. Scheduled Meds: . coumadin book   Does not apply Once  . digoxin  0.0625 mg Oral Daily  . enoxaparin  (LOVENOX) injection  1 mg/kg Subcutaneous Q12H  . feeding supplement (RESOURCE BREEZE)  1 Container Oral Q24H  . hydrocortisone cream   Topical BID  . mirtazapine  7.5 mg Oral QHS   Continuous Infusions: . sodium chloride Stopped (11/28/13 1400)   PRN Meds:.albuterol, dextrose, docusate sodium, polyethylene glycol Assessment/Plan:  #Pulmonary embolus- Large PE revealed on CT involving the Rt pulm art.  BLE Dopplers negative for DVT- 11/24/2013. He has reportedly not had a malignancy w/u i.e. Colonoscopy, will try to obtain [facility] today, and could consider CT abd/pelvis in future. Currently on Heparin gtt, not on coumadin given elevated INR- 2.03 today, though down from 2.94 three days ago.  Considerations were for Liver dx Vs Protein energy malnutrition with Vit K defc. But Uss of liver- No evid of cirrhosis.  -Hem-onc consult (Dr. Rosie Fate), appreciate recs;  -Commenced pt on Lovenox yesterday( As per pharmacy) , continue to monitor platelets, further drop today to 76 from 104 three days ago. -Palliative consult, appreciate recs. - Pt is also coughing- productive of sputum- Will get chest xray. - Possible discharge tomorrow to SNF.  #Chronic systolic heart failure, ?diastolic- EF 15% this admission. Per pt last EF was 48% but no records.  -Continue Digoxin 0.625 mg daily per cards recs, poor prognosis  -Heart failure team consulted as well   #Thrombocytopenia- Unknown baseline, platelets 76 today (down from 104 3 days ago). 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 Hem-onc requested add-on fibrinogen level and will look at peripheral smear checked to evaluate for DIC revealed- Rare Schizocytes, occasional acanthocytes, target cells and low platelets. Abdominal ultrasound yesterday showed no evidence of cirrhosis. Of note, patient does have elevated MCV with low Hgb thus could evaluate folate and B12 deficiency in outpatient setting.  -daily CBCs   #Elevated INR-  Possible etiologies-  Considerations are for low vitamin K intake, as pt has not been taking much orally. Abdominal ultrasound showed gallbladder wall thickening, no evidence of cirrhosis with small amount of ascites. Hepatitis panel pending.  -  Consider Oral Vit k. -INR in AM.   #Elevated serum creatinine- Baseline Cr unknown. Cr 1.36 today, down from 1.41 yesterday.  -continue to monitor, no IVFs for now   #Protein-calorie malnutrition, severe- Patient having early satiety and no appetite which may be due to chronic illness but again, malignancy workup is warranted. Also possible that vitamin K intake low thereby contributing to elevated INR.  -Nutrition consulted  -Regular diet. -Rameron- 7.5 mg daily, will help improve pts mood and appetite.  #DVT PPX  -Heparin gtt   Dispo: Disposition is deferred at this time, awaiting improvement of current medical problems.    The patient does have a current PCP Maryelizabeth Rowan MD ) and does not need an [facility] hospital follow-up appointment after discharge.  The patient does not know have transportation limitations that hinder transportation to clinic appointments.  .Services Needed at time of discharge: Y = Yes, Blank = No PT:   OT:   RN:   Equipment:   Other:     LOS: 6 days   Kennis Carina, MD 11/29/2013, 8:02 PM

## 2013-11-29 NOTE — Progress Notes (Signed)
Report called to RN on unit 6 [place].  Chaplain in to see patient

## 2013-11-29 NOTE — Progress Notes (Signed)
CSW received call from [facility] SNF--they are able to offer pt a bed. Pt and his sister are meeting with palliative this morning--CSW will read palliative note and present bed offer as appropriate, depending on pt's decision concerning goals of care and discharge plan.    Maryclare Labrador, MSW, [facility] Clinical Social Worker [phone]

## 2013-11-29 NOTE — Progress Notes (Signed)
  Date: 11/29/2013  Patient name: Chase [name]  Medical record number: [id number]  Date of birth: 03/10/1941   This patient has been seen and the plan of care was discussed with the house staff. Please see their note for complete details. I concur with their findings with the following additions/corrections: Drs Zada Girt, Mariea Clonts, and myself met with the pt, his sister, and a friend for about 30 min. We discussed the PE, origin of the PE's, RF for PE, time course for resolution, low BP, HF tx, need for PT, SNF, Lovenox, and transfer to regular bed today. Pt and sister's questions were answered. Likely D/C tomorrow to SNF.   Burns Spain, MD 11/29/2013, 1:29 PM

## 2013-11-29 NOTE — Progress Notes (Signed)
Chaplain was contacted earlier in the day to help a pt through an AD.  Chaplain met with pt earlier in the afternoon but pt wanted for his sister to be present.  Later on, chaplain met with pt and sister and informed them on the Healthcare POA.  They were grateful for the visit.  The began to review the document but still need it to be notarized.  Chaplain informed social work of this need, which can also be supplied by chaplain services.   Will follow up tomorrow.

## 2013-11-29 NOTE — Progress Notes (Signed)
CSW was contacted concerning HCPOA information, however when CSW came to visit Pt the Pastoral Services was already speaking with the Pt and family.   CSW will continue to follow Pt.  Chase [name] [facility]  4N 1[address]16;  [phone] Phone: [phone]

## 2013-11-29 NOTE — Progress Notes (Signed)
Patient WU:[id number] COUPER JUNCAJ      DOB: 1941-11-06      JYN:[id number]   Palliative Medicine Team at [facility] Progress Note    Subjective:  Met with patient and is his sister [name].  Patient more curious today about his condition and wanted to see the pictures of his CT scan.  After reviewing the CT scan Ernst stated he was not really in touch with how bad his condition was until that moment.  Afterwards , was some what pessimistic about his ability to recover and so we stopped our conference to give him some time to process.  I updated his sister on important process things for Cindee Lame to consider .  He continue to make if very clear that he desires to be full code. He would like to do a medical POA so his sister can help with his care.  He is some what overwhelmed with his decline and prognosis.  He is avoiding the topic of cancer screening very adeptly ( we talked about this as well).   Sister will be in town for only a little while and wants to get him settled.Nursing reports not eating, constipated but won't take anything offered.  Reviewed Dr. Benjiman Core note and appreciate his guidance.  Filed Vitals:   11/29/13 0755  BP: 100/78  Pulse:   Temp:   Resp: 19   Physical exam:  General: cachectic PERRL, EOMI, anicteric, mmm dry Chest decreased bilaterally, no wheezing but a lot of coughing and mucous production- thick bloody mucous CVS:  Intermittent pvcs, tachy but improved since Sunday. Abd: schapoid soft not tender Ext: thin,  No edema Neuro:  Highly intelligent but also highly afraid about what is happening to him.  Lab Results  Component Value Date   CREATININE 1.36* 11/29/2013   BUN 66* 11/29/2013   NA 139 11/29/2013   K 5.2* 11/29/2013   CL 101 11/29/2013   CO2 20 11/29/2013   .   Assessment and plan: 72 yr old white male with submassive PE of unclear etiology may be hepatic related although can't rule out malignance since he has been loosing weight and had proported constipation  that he is not letting the staff treat him for.  Increased cough with sputum production.  Spoke with Residents will check f/u xray.  1.  Full code status  2.  Add air mattress overlay  3.  Add moisture to nasal cannula  4/  Cough with sputum production in creased since Sunday.  Check chest xray . Treat if showing CHF (doubt) vs pneumonia.  5.  Patient has MOST form not willing to review to day.  I have spoken with SW about arranging for Medical POA to be done with his sister.   Total time:  9 am- 950  Am.  Lessie Funderburke L. Ladona Ridgel, MD MBA The Palliative Medicine Team at [facility] Phone: [phone] Pager: [phone]

## 2013-11-29 NOTE — Progress Notes (Signed)
Occupational Therapy Treatment Patient Details Name: NASZIR COTT MRN: [id number] DOB: 09/05/1941 Today's Date: 11/29/2013 Time: 1191-4782 OT Time Calculation (min): 32 min  OT Assessment / Plan / Recommendation  History of present illness 61 M with hx of PE 78yrs ago after LE trauma admitted with 3 wks of increasing LE edema and DOE. CTA chest revealed large PE. PCCM asked to see due to mild hypotension   OT comments  Pt requires max encouragement for all activity. Only performed pivot transfer to chair and 10 reps chair pushups - pt took multiple rest breaks and required max cues to continue.  Progress slow due to self limiting behaviors.   Follow Up Recommendations  SNF    Barriers to Discharge       Equipment Recommendations  None recommended by OT    Recommendations for Other Services    Frequency Min 2X/week   Progress towards OT Goals Progress towards OT goals: Progressing toward goals  Plan Discharge plan remains appropriate    Precautions / Restrictions Precautions Precautions: Fall   Pertinent Vitals/Pain     ADL  Toilet Transfer: Minimal assistance Toilet Transfer Method: Squat pivot Toileting - Clothing Manipulation and Hygiene: +1 Total assistance Where Assessed - Toileting Clothing Manipulation and Hygiene: Standing ADL Comments: Pt requires max encouragement to participate.  Stood x 1, but grabs for bedrail and pulls himself sideways.  Pt constantly states how terribly he is doing, and that he can't do what is asked.  Max encouragement provided.  Pt complaint that hospital bathrooms are not handicapped height and that is why he hasn't been able to use the bathroom.  Opened the door to show him the toilet, grab bars, etc. He states "well why would they tell me that, and why do they put me on that bedside commode?"   Support provided and offered to take pt to BR, but he refused saying he didn't need to go.  THen offered to take him in to practice, but he had  multiple reasons why he could not perform task.  Pt just transferred to recliner.     OT Diagnosis:    OT Problem List:   OT Treatment Interventions:     OT Goals(current goals can now be found in the care plan section) Acute Rehab OT Goals Patient Stated Goal: be able to return home OT Goal Formulation: With patient Time For Goal Achievement: 12/09/13 Potential to Achieve Goals: Good ADL Goals Pt Will Perform Grooming: with min guard assist;standing Pt Will Perform Lower Body Bathing: with min guard assist;sit to/from stand Pt Will Transfer to Toilet: with min guard assist;bedside commode Additional ADL Goal #1: Pt will complete bed mobility MOD I without rails  Visit Information  Last OT Received On: 11/29/13 History of Present Illness: 7 M with hx of PE 1yrs ago after LE trauma admitted with 3 wks of increasing LE edema and DOE. CTA chest revealed large PE. PCCM asked to see due to mild hypotension    Subjective Data      Prior Functioning       Cognition  Cognition Arousal/Alertness: Awake/alert Behavior During Therapy: Anxious Overall Cognitive Status: No family/caregiver present to determine baseline cognitive functioning Area of Impairment: Attention;Safety/judgement Current Attention Level: Sustained (with cuesq) Safety/Judgement: Decreased awareness of safety General Comments: Pt requires max cues for hand placement and safety.  Pt grabs for anything in room, and will contort body.  When cues provided, pt argues    Mobility  Bed Mobility Bed Mobility:  Supine to Sit;Sitting - Scoot to Edge of Bed Supine to Sit: 4: Min assist;With rails;HOB elevated Sitting - Scoot to Edge of Bed: 4: Min guard Details for Bed Mobility Assistance: step by step instruction.  Assist to lift shoulders from bed Transfers Transfers: Sit to Stand;Stand to Sit Sit to Stand: 4: Min assist;With upper extremity assist;From bed Stand to Sit: 4: Min assist;With upper extremity assist;To  chair/3-in-1 Details for Transfer Assistance: Cuing for hand placement and assist to move into standing     Exercises  General Exercises - Upper Extremity Chair Push Up: Strengthening;Both;10 reps;Seated (required 4 rest breaks to perform)   Balance     End of Session OT - End of Session Activity Tolerance: Other (comment) (Pt is self limiting) Patient left: in chair;with call bell/phone within reach Nurse Communication: Mobility status  GO     Jeani Hawking M 11/29/2013, 12:53 PM

## 2013-11-29 NOTE — Progress Notes (Signed)
CSW received call from provider that pt's sister requested CSW visit--CSW spoke with pt & sister at length about placement. Pt has received bed offer from [facility], and CSW informed pt and sister of bed offer. CSW called [facility] and explained that pt likely discharging from hospital tomorrow or Wednesday, per pt. Pt states he will be transferred to stepdown unit before discharging from hospital. CSW called [facility] and notified them that pt will not be discharging until tomorrow or Wednesday, pending MD recommendation. [facility] can still offer bed. CSW spoke at length with pt's sister, who wants to fill out HPOA and advanced directives paperwork but needs to get her car serviced this afternoon because she is driving to [employer]. CSW paged spiritual care again to request that they get in touch with [name] to schedule a time to fill out paperwork. CSW awaiting call from spiritual care and will provide them with Sharon's cell number to schedule appointment.   Maryclare Labrador, MSW, [facility] Clinical Social Worker [phone]

## 2013-11-29 NOTE — Progress Notes (Signed)
Arrived to room 6n10, oriented to room and surroundings, denies pain/nausea at this time,OOB in chair.

## 2013-11-29 NOTE — Progress Notes (Signed)
NUTRITION FOLLOW UP  DOCUMENTATION CODES  Per approved criteria   -Severe malnutrition in the context of chronic illness    Intervention:   1.  Supplements; discontinue Ensure Complete po TID, each supplement provides 350 kcal and 13 grams of protein.  Trial Resource Breeze po TID, each supplement provides 250 kcal and 9 grams of protein. 2.  General healthful diet; encourage intake of foods and beverages as able.  RD to follow and assess for nutritional adequacy.  Nutrition Dx:   Malnutrition, ongoing.   Monitor:   1.  Food/Beverage; pt meeting >/=90% estimated needs with tolerance.  Not met , pt intake has not improved 2.  Wt/wt change; monitor trends.  Ongoing.   Assessment:   76 M with hx of PE 28yrs ago after LE trauma, admitted on 11/26 with 3 wks of increasing LE edema and DOE. CTA chest revealed large PE.  Reported 70 lbs wt loss due to eating poorly and constipation.   Intake has improved slightly to 50-75% of some meals, however intake still inadequate.   Last documented BM was (11/29).  Currently on miralax and colace however pt is refusing. Pt is also refusing Ensure.  C him urrently working with PT. Discussed with RN who report pt is fearful of constipation, but also fearful of diarrhea.   RD met with pt during lunch.  Pt denies hunger.  States he doesn't do enough to work up an appetite.  Frequent complaints of "lack of control."  Pt ate asparagus tips "because I'm from where they grow them."  RD was able to distract pt enough to drink an entire carton of chocolate milk.  Pt is not receptive to encouragement; very self deprecating and resistant to new ideas on improving nutrition.  Pt dismisses RDs attempts to show pt nutritional deficits.   GOC meeting this morning; pt remains full code.   Height: Ht Readings from Last 1 Encounters:  11/23/13 6\' 3"  (1.905 m)    Weight Status:   Wt Readings from Last 1 Encounters:  11/29/13 178 lb 12.7 oz (81.1 kg)    Re-estimated  needs:  Kcal: 2400-2600 Protein: 115-130g Fluid: ~2.4 L/day  Skin: Abrasion to tibia  Diet Order: General   Intake/Output Summary (Last 24 hours) at 11/29/13 0957 Last data filed at 11/29/13 0800  Gross per 24 hour  Intake    575 ml  Output    575 ml  Net      0 ml    Last BM: 11/29  Labs:   Recent Labs Lab 11/23/13 1514 11/23/13 2140  11/27/13 0520 11/28/13 0450 11/29/13 0415  NA 141  --   < > 138 137 139  K 3.9  --   < > 5.1 4.7 5.2*  CL 99  --   < > 100 102 101  CO2 27  --   < > 24 23 20   BUN 45*  --   < > 61* 65* 66*  CREATININE 1.39*  --   < > 1.53* 1.41* 1.36*  CALCIUM 9.2  --   < > 8.7 8.6 8.6  MG  --  2.1  --   --   --   --   PHOS  --  4.0  --   --   --   --   GLUCOSE 84  --   < > 107* 108* 96  < > = values in this interval not displayed.  CBG (last 3)   Recent Labs  11/26/13  1428 11/28/13 0820  GLUCAP 99 95    Scheduled Meds: . coumadin book   Does not apply Once  . digoxin  0.0625 mg Oral Daily  . enoxaparin (LOVENOX) injection  1 mg/kg Subcutaneous Q12H  . feeding supplement (ENSURE COMPLETE)  237 mL Oral TID BM  . hydrocortisone cream   Topical BID    Continuous Infusions: . sodium chloride Stopped (11/28/13 1400)    Loyce Dys, MS RD LDN Clinical Inpatient Dietitian Pager: [phone] Weekend/After hours pager: [phone]

## 2013-11-30 DIAGNOSIS — I959 Hypotension, unspecified: Secondary | ICD-10-CM

## 2013-11-30 DIAGNOSIS — E162 Hypoglycemia, unspecified: Secondary | ICD-10-CM

## 2013-11-30 DIAGNOSIS — E875 Hyperkalemia: Secondary | ICD-10-CM

## 2013-11-30 LAB — BASIC METABOLIC PANEL
BUN: 69 mg/dL — ABNORMAL HIGH (ref 6–23)
CO2: 22 mEq/L (ref 19–32)
Calcium: 8.7 mg/dL (ref 8.4–10.5)
Creatinine, Ser: 1.37 mg/dL — ABNORMAL HIGH (ref 0.50–1.35)
GFR calc non Af Amer: 50 mL/min — ABNORMAL LOW (ref >90)
Glucose, Bld: 112 mg/dL — ABNORMAL HIGH (ref 70–99)
Potassium: 5.1 mEq/L (ref 3.5–5.1)

## 2013-11-30 LAB — CBC
HCT: 40.8 % (ref 39.0–52.0)
Hemoglobin: 13.4 g/dL (ref 13.0–17.0)
MCH: 34.3 pg — ABNORMAL HIGH (ref 26.0–34.0)
MCHC: 32.8 g/dL (ref 30.0–36.0)
MCV: 104.3 fL — ABNORMAL HIGH (ref 78.0–100.0)

## 2013-11-30 MED ORDER — DIGOXIN 62.5 MCG PO TABS: 0.0625 mg | ORAL_TABLET | Freq: Every day | ORAL | Status: DC

## 2013-11-30 MED ORDER — BOOST / RESOURCE BREEZE PO LIQD: 1.0000 | ORAL | Status: DC

## 2013-11-30 MED ORDER — ENOXAPARIN SODIUM 80 MG/0.8ML ~~LOC~~ SOLN: 1.0000 mg/kg | Freq: Two times a day (BID) | SUBCUTANEOUS | Status: DC

## 2013-11-30 MED ORDER — MIRTAZAPINE 7.5 MG PO TABS: 7.5000 mg | ORAL_TABLET | Freq: Every day | ORAL | Status: DC

## 2013-11-30 MED ORDER — POLYETHYLENE GLYCOL 3350 17 G PO PACK: 17.0000 g | PACK | Freq: Every day | ORAL | Status: AC | PRN

## 2013-11-30 NOTE — Progress Notes (Signed)
Chaplain received referral from [facility] and Palliative Care to complete an Advance Directive. A HCPOA was completed. Three copies were made. The original and two copies were given to the patient. One copy was given to nursing to be placed in the patient's records.   11/30/13 1200  Clinical Encounter Type  Visited With Patient;Family;Health care provider;Other (Comment) (Witnesses for Advance Directive/Notary)  Visit Type Initial;Other (Comment) (Advance Directive )  Referral From Chaplain;Palliative care team

## 2013-11-30 NOTE — Progress Notes (Signed)
CSW spoke with [facility] Tresa Endo [phone]) and Pt is clear to come to facility today.   CSW working on d/c packet and further d/c planning.   CSW will continue to follow Pt for d/c planning.    Leron Croak [facility]  [address]N [address];  [phone] Phone: [phone]

## 2013-11-30 NOTE — Progress Notes (Signed)
PT Cancellation Note  Patient Details Name: Chase [name] MRN: [id number] DOB: October 29, 1941   Cancelled Treatment:    Reason Eval/Treat Not Completed: Fatigue/lethargy limiting ability to participate.  Pt anticipates D/C to SNF today and wishes to rest this am.  Will f/u tomorrow if does not D/C.     Sunny Schlein, [place] [phone] 11/30/2013, 11:11 AM

## 2013-11-30 NOTE — Progress Notes (Signed)
Subjective: Patient not in a good mood today. Sister present at bedside. Asked many questions that we answered. Wants to have a hospital bed and matrass sent to the facility he is going to. Also complaining about his appetite been poor. No chest pain, nausea or vomiting.   Objective: Vital signs in last 24 hours: Filed Vitals:   11/29/13 2101 11/30/13 0500 11/30/13 0542 11/30/13 1346  BP: 99/65  93/58 103/74  Pulse: 78  79 80  Temp: 97.9 F (36.6 C)  98 F (36.7 C) 97.3 F (36.3 C)  TempSrc: Oral  Oral Oral  Resp: 18  16 18   Height:      Weight:  175 lb 14.8 oz (79.8 kg)    SpO2: 100%  100% 99%   Weight change: -2 lb 13.9 oz (-1.3 kg)  Intake/Output Summary (Last 24 hours) at 11/30/13 1352 Last data filed at 11/30/13 1234  Gross per 24 hour  Intake    270 ml  Output    975 ml  Net   -705 ml   General: Not in any distress, lying in bed, on Nasal cannula- O2.  HEENT: EOMI, normocephalic and atraumatic. Cardiac: No rubs, murmurs or gallops. Pulm: Clear to auscultation bilat.  Abd: soft, nontender, nondistended, BS present  Ext: Skin of right leg appears dry ,not tender, no calf tenderness, but pt says its been that way for weeks, no recent changes. Neuro: alert and oriented X3, cranial nerves II-XII grossly intact  Skin: spider veins on his Nose.   Lab Results: Basic Metabolic Panel:  Recent Labs Lab 11/23/13 1514 11/23/13 2140  11/29/13 0415 11/30/13 0504  NA 141  --   < > 139 138  K 3.9  --   < > 5.2* 5.1  CL 99  --   < > 101 102  CO2 27  --   < > 20 22  GLUCOSE 84  --   < > 96 112*  BUN 45*  --   < > 66* 69*  CREATININE 1.39*  --   < > 1.36* 1.37*  CALCIUM 9.2  --   < > 8.6 8.7  MG  --  2.1  --   --   --   PHOS  --  4.0  --   --   --   < > = values in this interval not displayed. Liver Function Tests:  Recent Labs Lab 11/23/13 1514 11/24/13 0427  AST 66* 68*  ALT 69* 71*  ALKPHOS 101 108  BILITOT 2.0* 1.8*  PROT 5.5* 5.0*  ALBUMIN 3.4* 3.1*    CBC:  Recent Labs Lab 11/23/13 1514  11/29/13 0415 11/30/13 0504  WBC 4.8  < > 5.5 5.6  NEUTROABS 3.0  --   --   --   HGB 13.0  < > 12.5* 13.4  HCT 38.8*  < > 38.0* 40.8  MCV 103.7*  < > 105.3* 104.3*  PLT 99*  < > 76* 77*  < > = values in this interval not displayed. Cardiac Enzymes:  Recent Labs Lab 11/23/13 1514 11/23/13 2140 11/24/13 0427  TROPONINI <0.30 <0.30 <0.30   BNP:  Recent Labs Lab 11/23/13 1514  PROBNP 37244.0*   CBG:  Recent Labs Lab 11/26/13 1428 11/28/13 0820  GLUCAP 99 95   Coagulation:  Recent Labs Lab 11/27/13 0520 11/28/13 0450 11/29/13 0415 11/30/13 0504  LABPROT 27.0* 25.0* 22.3* 21.7*  INR 2.61* 2.36* 2.03* 1.96*    Micro Results: Recent Results (from  the past 240 hour(s))  MRSA PCR SCREENING     Status: None   Collection Time    11/23/13  7:16 PM      Result Value Range Status   MRSA by PCR NEGATIVE  NEGATIVE Final   Comment:            The GeneXpert MRSA Assay (FDA     approved for NASAL specimens     only), is one component of a     comprehensive MRSA colonization     surveillance program. It is not     intended to diagnose MRSA     infection nor to guide or     monitor treatment for     MRSA infections.   Studies/Results: Dg Chest 2 View  11/29/2013   CLINICAL DATA:  Cough  EXAM: CHEST  2 VIEW  COMPARISON:  Portable chest x-ray of 11/23/2013  FINDINGS: There is moderate cardiomegaly present with small effusions suggesting mild congestive heart failure. There may be minimal pulmonary vascular congestion present. No acute bony abnormality is seen.  IMPRESSION: Cardiomegaly and small effusions may indicate mild CHF. No definite pneumonia is seen.   Electronically Signed   By: Dwyane Dee M.D.   On: 11/29/2013 17:06   Medications: I have reviewed the patient's current medications. Scheduled Meds: . coumadin book   Does not apply Once  . digoxin  0.0625 mg Oral Daily  . enoxaparin (LOVENOX) injection  1 mg/kg  Subcutaneous Q12H  . feeding supplement (RESOURCE BREEZE)  1 Container Oral Q24H  . hydrocortisone cream   Topical BID  . mirtazapine  7.5 mg Oral QHS   Continuous Infusions: . sodium chloride Stopped (11/28/13 1400)   PRN Meds:.albuterol, dextrose, docusate sodium, polyethylene glycol Assessment/Plan:  #Pulmonary embolus- Large PE revealed on CT involving the Rt pulm art. BLE Dopplers negative for DVT- 11/24/2013.Currently on Heparin gtt, not on coumadin given elevated INR in the past few days, but Today- 1.96, down from 2.94 four days ago. Likely etiology- Vit K defc, with Protein energy malnutrition. Patient is also having a productive cough, chest xray suggested by palliative which showed no concern for Pneumonia or infection. Currently on Lovenox. -Hem-onc consult (Dr. Rosie Fate), appreciate recs. -Continue  Lovenox ( As per pharmacy) , continue to monitor platelets, platelets appears to have stabilized today- 77. Compared to 76  from 104 three days ago.  -Palliative consult, appreciate recs.  - Discharge home today to SNF. - Pt wanted hospital bed and Matrass at the SNF facility, but I talked to the case manager and she said unless pt is going home, that could be arranged, but not to a SNF where they have similar beds.   #Chronic systolic heart failure, ?diastolic- EF 15% this admission. Per pt last EF was 48% but no records.  -Continue Digoxin 0.625 mg daily per cards recs, poor prognosis  -Heart failure team consulted as well   #Thrombocytopenia- Unknown baseline, platelets stablize today at 77,  platelets 76 yesterday (down from 104  Four days ago). Unlikley HIT, drop in platelet is not up to 50%, using 4Ts criteria, pt is low probabilty for HIT and does not warrant workup with HIT antibody. Possibilty include mediaction side effect, as digoxin can cause thrombocytopenia. Factor 8 levels- 566, elevated, factor 5 and 7 pending. Peripheral smear checked to evaluate for DIC revealed- Rare  Schizocytes, occasional acanthocytes, target cells and low platelets- DIC unlikely. -daily CBCs   #Elevated INR- Possible etiologies- Considerations are for low  vitamin K intake, as pt has not been taking much orally. Abdominal ultrasound showed gallbladder wall thickening, no evidence of cirrhosis with small amount of ascites. Hepatitis panel - Negative.  - Consider Oral Vit k.  - INR in AM.   #Elevated serum creatinine- Baseline Cr unknown. Cr 1.37 today, down from 1.63 4 days ago.  -continue to monitor.  #Protein-calorie malnutrition, severe- Patient having early satiety and no appetite which may be due to chronic illness but again, malignancy workup is warranted. Also possible that vitamin K intake low thereby contributing to elevated INR.  -Nutrition consulted  -Regular diet.  -Rameron- 7.5 mg daily, will help improve pts mood and appetite, increase dose as indicated.   #DVT PPX  -Heparin gtt   Dispo: Disposition is deferred at this time, awaiting improvement of current medical problems.  Anticipated discharge in approximately today.   The patient does not know have a current PCP (No primary provider on file.) and does not know need an [facility] hospital follow-up appointment after discharge.  The patient does not know have transportation limitations that hinder transportation to clinic appointments.  .Services Needed at time of discharge: Y = Yes, Blank = No PT:   OT:   RN:   Equipment:   Other:     LOS: 7 days   Kennis Carina, MD 11/30/2013, 1:52 PM

## 2013-11-30 NOTE — Progress Notes (Signed)
Pt to be d/c today to [facility].m   Pt and family agreeable. Confirmed plans with facility.  Plan transfer via EMS. Nurse will call PTAR after Pts bath per Pt.    Leron Croak [facility]  4N 1[address]16;  [phone] Phone: [phone]

## 2013-11-30 NOTE — Progress Notes (Signed)
Report called to [place] at [facility].

## 2013-11-30 NOTE — Progress Notes (Signed)
  Date: 11/30/2013  Patient name: Chase [name]  Medical record number: [id number]  Date of birth: June 17, 1941   This patient has been seen and the plan of care was discussed with the house staff. Please see their note for complete details. I concur with their findings with the following additions/corrections: [name] was visited in his 6N room. His sister was present. He was a bit cantankerous today. He is stable to transfer to [facility]. He will need PT, OT, nutrition, and air mattress overlay.   Burns Spain, MD 11/30/2013, 1:31 PM

## 2013-11-30 NOTE — Discharge Summary (Signed)
Name: Chase [name] MRN: [id number] DOB: [birth year] 72 y.o. PCP: No primary provider on file.  Date of Admission: 11/23/2013  2:44 PM Date of Discharge: 11/30/2013 Attending Physician: Burns Spain, MD  Discharge Diagnosis: Principal Problem:   Pulmonary embolus Active Problems:   Pulmonary HTN   Elevated serum creatinine   CHF (congestive heart failure)   Chronic systolic heart failure   Protein-calorie malnutrition, severe   Thrombocytopenia, unspecified   Hypoglycemia   Hypotension, unspecified   Elevated INR   Hyperkalemia  Discharge Medications:   Medication List    STOP taking these medications       carvedilol 3.125 MG tablet  Commonly known as:  COREG     cephALEXin 500 MG capsule  Commonly known as:  KEFLEX     docusate sodium 100 MG capsule  Commonly known as:  COLACE      TAKE these medications       albuterol 108 (90 BASE) MCG/ACT inhaler  Commonly known as:  PROVENTIL HFA;VENTOLIN HFA  Inhale 1-2 puffs into the lungs every 6 (six) hours as needed for wheezing or shortness of breath.     aspirin 325 MG tablet  Take 325 mg by mouth daily as needed for moderate pain.     Digoxin 62.5 MCG Tabs  Take 0.0625 mg by mouth daily.     enoxaparin 80 MG/0.8ML injection  Commonly known as:  LOVENOX  Inject 0.8 mLs (80 mg total) into the skin every 12 (twelve) hours.     feeding supplement (RESOURCE BREEZE) Liqd  Take 1 Container by mouth daily.     mirtazapine 7.5 MG tablet  Commonly known as:  REMERON  Take 1 tablet (7.5 mg total) by mouth at bedtime.     polyethylene glycol packet  Commonly known as:  MIRALAX / GLYCOLAX  Take 17 g by mouth daily as needed for mild constipation.        Disposition and follow-up:   Chase [name] was discharged from [facility] in Fair condition.  At the hospital follow up visit please address:  1.  Monitor INR, Platelet, Digoxin levels.   2.  Pending labs/ test needing  follow-up: Factor 5 and 7  3. Screening colonoscopy when stable to check for underlying malignancy to explain weightloss. Also other cancer screening as appropriate.  4. Rameron dose can be increased as indicated.  4. Physical Therapy, occupational Therapy, and Nutrition consult to help with appetite.  Follow-up Appointments: Follow-up Information   Follow up with [facility].   Contact information:   [address] [place] [address] [phone]       Discharge Instructions: Discharge Orders   Future Orders Complete By Expires   Diet - low sodium heart healthy  As directed    Discharge instructions  As directed    Comments:     Thank you for allowing [place] to be involved in your healthcare while you were hospitalized [facility].  Please note that there have been changes to your home medications.  --> PLEASE LOOK AT YOUR DISCHARGE MEDICATION LIST FOR DETAILS. You will be discharged to a nursing home to help you rebuild your strength before you can be able to go home.    Please call your PCP if you have any questions or concerns, or any difficulty getting any of your medications. Please return to the ER if you have worsening of your symptoms or new severe symptoms arise.  Increase activity slowly  As directed       Consultations: Treatment Team:  Palliative Nilda Riggs, MD  Procedures Performed:  Dg Chest 2 View  11/29/2013   CLINICAL DATA:  Cough  EXAM: CHEST  2 VIEW  COMPARISON:  Portable chest x-ray of 11/23/2013  FINDINGS: There is moderate cardiomegaly present with small effusions suggesting mild congestive heart failure. There may be minimal pulmonary vascular congestion present. No acute bony abnormality is seen.  IMPRESSION: Cardiomegaly and small effusions may indicate mild CHF. No definite pneumonia is seen.   Electronically Signed   By: Dwyane Dee M.D.   On: 11/29/2013 17:06   Dg Chest 2 View  11/17/2013    CLINICAL DATA:  Shortness of breath.  EXAM: CHEST  2 VIEW  COMPARISON:  Single view of the chest 07/27/2006.  FINDINGS: There is cardiomegaly without edema. The chest is hyperexpanded with attenuation of the pulmonary vasculature compatible with emphysema. Trace right pleural effusion is noted. No focal bony abnormality is identified.  IMPRESSION: Trace right pleural effusion.  Emphysema.  Cardiomegaly without edema.   Electronically Signed   By: Drusilla Kanner M.D.   On: 11/17/2013 12:33   Dg Knee 1-2 Views Left  11/17/2013   CLINICAL DATA:  Bilateral knee pain, right greater than left, no trauma  EXAM: LEFT KNEE - 1-2 VIEW  COMPARISON:  None.  FINDINGS: There is mild bicompartmental degenerative joint disease. There is some loss of medial joint space with sclerosis, with slight loss of patella femoral articulation. No fracture is seen and no joint effusion is noted.  IMPRESSION: Mild bicompartmental degenerative joint disease.   Electronically Signed   By: Dwyane Dee M.D.   On: 11/17/2013 12:40   Dg Knee 1-2 Views Right  11/17/2013   CLINICAL DATA:  Knee pain, right greater than left, no acute trauma  EXAM: RIGHT KNEE - 1-2 VIEW  COMPARISON:  None.  FINDINGS: There is mild bicompartmental degenerative joint disease with some loss of joint space medially and at the patella femoral articulation. No fracture is seen. No effusion is noted. Arterial calcifications are present.  IMPRESSION: Mild bicompartmental degenerative joint disease.   Electronically Signed   By: Dwyane Dee M.D.   On: 11/17/2013 12:39   Ct Angio Chest Pe W/cm &/or Wo Cm  11/23/2013   CLINICAL DATA:  History of PE, right-sided chest pain and shortness of breath for 3.5 weeks  EXAM: CT ANGIOGRAPHY CHEST WITH CONTRAST  TECHNIQUE: Multidetector CT imaging of the chest was performed using the standard protocol during bolus administration of intravenous contrast. Multiplanar CT image reconstructions including MIPs were obtained to  evaluate the vascular anatomy.  CONTRAST:  OMNIPAQUE IOHEXOL 350 MG/ML SOLN  COMPARISON:  Chest x-ray 11/17/2013  FINDINGS: Mediastinum: Unremarkable CT appearance of the thyroid gland. No suspicious mediastinal or hilar adenopathy. No soft tissue mediastinal mass. The thoracic esophagus is unremarkable.  Heart/Vascular: Adequate opacification of the pulmonary arteries to the proximal subsegmental level. Large volume of thrombus noted in the right main pulmonary artery extending in to the right lower lobar pulmonary artery and its segmental branches. At least 1 small segmental PE is noted in the inferior lingular artery. The main pulmonary artery is dilated with a transverse diameter of 4.4 cm. There is evidence of right heart strain. The RV/LV ratio is 0.92. Additionally, there is reflux of contrast material into the suprahepatic IVC and right hepatic veins.  Overall, there is cardiomegaly with both the left and  right heart enlargement. Atherosclerotic calcifications are noted throughout the coronary arteries. No pericardial effusion.  Lungs/Pleura: Small-moderate bilateral layering pleural effusions with associated subsegmental atelectasis. The lungs are hyperinflated. No focal airspace consolidation or infiltrate. No suspicious pulmonary nodule or mass.  Bones/Soft Tissues: No acute fracture or aggressive appearing lytic or blastic osseous lesion. Multilevel degenerative disc disease.  Upper Abdomen: Incompletely imaged sub cm low-attenuation lesions in the right and left liver are incompletely characterized but low in attenuation and a most consistent with small hepatic cysts. Additionally, there is a incompletely imaged 2.2 cm low-attenuation lesion in the upper pole left kidney which is also statistically likely a simple cyst.  Review of the MIP images confirms the above findings.  IMPRESSION: 1. Large volume pulmonary embolus in the right main pulmonary artery extending into the right lower lobar  pulmonary artery and its segmental branches with an additional small segmental PE in the inferior lingula with evidence of right heart strain (RV/LV ratio = 0.92) consistent with intermediate risk/sub massive PE. No airspace infiltrate or evidence of pulmonary infarction. Given the appearance of slight peripheralization of thrombus within the pulmonary arterial lumens, this may reflect a subacute PE. Recommend clinical correlation with the duration of the patient's underlying symptoms. 2. The main pulmonary artery is markedly enlarged suggesting underlying pulmonary arterial hypertension. 3. Cardiomegaly with both right and left sided enlargement. 4. Atherosclerosis including coronary artery disease. 5. Moderate bilateral layering pleural effusions with associated mild subsegmental atelectasis. 6. Incompletely evaluated low-attenuation lesions in the liver and left kidney are statistically highly likely benign cysts.  Critical Value/emergent results were called by telephone at the time of interpretation on 11/23/2013 at 2:32 PM to [facility] , who verbally acknowledged these results. The patient will be sent from the outpatient imaging center to the [facility] emergency room.  Signed,  Sterling Big, MD  [facility]  [facility]   Electronically Signed   By: Malachy Moan M.D.   On: 11/23/2013 14:50   US Abdomen Complete  11/26/2013   CLINICAL DATA:  72 year old male with abdominal pain and elevated INR.  EXAM: ULTRASOUND ABDOMEN COMPLETE  COMPARISON:  None.  FINDINGS: Gallbladder:  Gallbladder sludge is noted. Moderate gallbladder wall thickening is present which may be related to hepatic dysfunction or ascites, although cholecystitis is not entirely excluded. There is no evidence of cholelithiasis or sonographic Murphy sign.  Common bile duct:  Diameter: 4.2 mm. There is no evidence of intrahepatic or extrahepatic biliary dilatation.  Liver:  No  morphologic evidence of cirrhosis is identified on this study. Hepatic echogenicity is unremarkable. No focal hepatic abnormalities are noted.  IVC:  The IVC is dilated compatible with this patient's right heart strain.  Pancreas:  Visualized portion unremarkable.  Spleen:  Size and appearance within normal limits.  Right Kidney:  Length: 10.8 cm. Echogenicity within normal limits. No mass or hydronephrosis visualized.  Left Kidney:  Length: 10.7 cm. A 2.8 cm cyst is present. Echogenicity within normal limits. No solid mass or hydronephrosis visualized.  Abdominal aorta:  No aneurysm visualized.  Other findings:  Bilateral pleural effusions and small amount of ascites noted.  IMPRESSION: Gallbladder wall thickening and gallbladder sludge. This gallbladder wall thickening is felt to the be related to hepatic dysfunction or ascites. If there is strong clinical suspicion for acute cholecystitis, consider nuclear medicine study.  No morphologic evidence of cirrhosis identified on this study.  Bilateral pleural effusions and small amount of ascites.   Electronically  Signed   By: Laveda Abbe M.D.   On: 11/26/2013 20:33   Dg Chest Port 1 View  11/23/2013   CLINICAL DATA:  Shortness of breath.  EXAM: PORTABLE CHEST - 1 VIEW  COMPARISON:  10/2013 chest radiographs  FINDINGS: Moderate enlargement of the cardiac silhouette is similar to the prior exam. The lungs remain hyperinflated with slightly increased patchy and streaky opacities in the lung bases. Small right pleural effusion remains. The left costophrenic angle is excluded from the image. There is no evidence of pulmonary edema or pneumothorax. No acute osseous abnormality is identified.  IMPRESSION: 1. Slightly increased bibasilar lung opacities, which could reflect subsegmental atelectasis. Aspiration or developing infection are other considerations. 2. Cardiomegaly without overt edema. 3. Persistent small right pleural effusion. Known left pleural effusion is not  well seen.   Electronically Signed   By: Sebastian Ache   On: 11/23/2013 15:24    2D Echo: Done- 11/24/2013. LV EF- 15% The cavity size was severely dilated. Wall thickness was increased in a pattern of mild LVH. The estimated ejection fraction was 15%. Diffuse hypokinesis. Regional wall motion abnormalities: There is severe hypokinesis of the entireinferior myocardium. The study is not technically sufficient to allow evaluation of LV diastolic function.  Cardiac Cath: None.  Admission HPI: Chief Complaint: SOB  History of Present Illness: Chase [name] is a 72 year old Caucasian male with PMH of CHF ([facility]), CVA with intracerebral hematoma (as per patient), and PE (treated with coumadin x6 months in the past) who was sent to ED from radiology office for PE on imaging. He reports that over the past 3-4 weeks he has had progressive SOB and lower extremity swelling L>R. He also endorses not being able to lie flat for the past few weeks, requiring up to 3 pillows at night. Due to his worsening SOB, he reports being fairly immobile. He has recently been seeing Dr. Duanne Guess for these complaints as it has been too difficult to get in to see the [place]. He reports that he had an "infection" of his left leg for which he was given a 10 day course of oral antibiotics initially that he completed, then another 15 day course of which he is on day 5 (unknown antibiotics). He reports that his SOB continued to get worse and that is why he returned to Dr. Duanne Guess today. He was subsequently sent to have a CTA of his chest which found a large volume pulmonary embolus and was subsequently transferred to [facility] ED by EMS.  Of note, he endorses his prior PE was 4-5 years ago after trauma to his left leg, completed ~6 months of coumadin. He claims he has been told he has "severe CHF" and had an echocardiogram last year. He says he has a stroke during a prior hospitalization several years ago after  heart catheterization and has some residual weakness on left side since then. He stopped smoking approximately 25-30 years ago and has a history of marijuana, cocaine (tried a couple of times), and alcohol use as well.   Physical Exam-   Blood pressure 107/72, pulse 58, temperature 97.9 F (36.6 C), temperature source Oral, resp. rate 16, height 6\' 3"  (1.905 m), weight 169 lb 5 oz (76.8 kg), SpO2 98.00%. on 2L [place]  Physical Exam  Nursing note and vitals reviewed.  Constitutional: He is oriented to person, place, and time and well-developed, well-nourished, and in no distress. No distress.  HENT:  Head: Normocephalic and atraumatic.  Eyes: EOM are normal. Pupils are equal, round, and reactive to light.  Cardiovascular: Normal rate, regular rhythm and normal heart sounds.  No murmur heard.  Pulses:  Dorsalis pedis pulses are 0 on the right side, and 0 on the left side.  Posterior tibial pulses are 0 on the right side, and 0 on the left side.  Bedside doppler did not reveal DP or PT pulses B/L. Femoral pulses in tact  Pulmonary/Chest: Effort normal. No respiratory distress. He has no wheezes. He has no rales.  Decreased bibasilar breath sounds  Abdominal: Soft. Bowel sounds are normal. He exhibits no distension. There is no tenderness. There is no rebound.  Musculoskeletal: He exhibits edema (3+ LE edema to left grion, 2+ LE edema to right knee).  Neurological: He is alert and oriented to person, place, and time. No cranial nerve deficit.  Decreased sensation b/l feet, Left worse than right  Skin: Skin is warm and dry. He is not diaphoretic.  Left lower extremity dry scaly skin, burst healing blister.   Hospital Course by problem list: Principal Problem:   Pulmonary embolus Active Problems:   Pulmonary HTN   Elevated serum creatinine   CHF (congestive heart failure)   Chronic systolic heart failure   Protein-calorie malnutrition, severe   Thrombocytopenia, unspecified    Hypoglycemia   Hypotension, unspecified   Elevated INR   Hyperkalemia   # Submassive Pulmonary Embolism (with evidence of right heart strain)- Pt presented with Progressive SOB over 3-4 weeks, with Swelling of his legs, worse on the Left. Patient has a history of provoked PE 4-5 in 2007 (after lower extremity trauma), was on Coumadin for 6 months at that time but no anticoagulation since then. CTA ordered by his PCP as an outpatient which showed submassive PE- Involving the Rt Pulmonary artery. Pt was started on Heparin drip and placed on O2 by Nasal cannular with good O2 sats .But Pts INR at presentation 11/23/2013- 1.58, but gradually increased to a peak of 2.94 on 11/26/2013, without receiving Coumadin. Lower extremity Duplex was negative for Clots. Pt also has a hx of intracerebral bleed 2/2 CVA and multiple recent falls. Hem onc was consulted given reported history of intracerebral bleed and falls risk but need for lifelong anticoagulation. Recs by Hem-Onc were to Cont Heparin gtt, and given second PE will require lifelong anticoagulation, to hold coumadin given increased INR off coumadin. Considerations were for Vit K defc with Protein energy malnutrition, as pt reported reduced appetite and intake, with significant weightloss,VS DIC VS liver dx, as LFTs were mildly elevated. Factor 8, 9, 7, levels were ordered, also Abdominal USS was done, which did not show any evidence of Cirrhosis. Factor- 8 levels were elevated at 566, Factor 5 and 7 levels are still pending. Peripheral blood smear showed rare schizocytes, occasional acathocytes and target cells and decreased plts, making DIC less likely. During the course of his hospital stay, blood pressure was on the low side, but stable, likely due to Pulm Embolism given the size of the clot. Palliative care was consulted given pt poor prognosis, Pt remains Full code. Pts INR was monitored throughout admission, On discharge- 1.96. On discharge pt felt better  clinically, and reported significant improvement in his Breathing.  #Pulmonary HTN with history of CHF- markedly enlarged main pulmonary artery on CT. No echocardiogram on file, Presentation likely multifactorial given PE and likely CHF. He does have LE edema and elevated proBNP with moderate bilateral pleural effusions on  imaging. Pt was monitored closely. Echo revealed- PA peak pressure: 60mm Hg (S), Pulmonic valve: Poorly visualized, Doppler- showed Moderate regurgitation.   # CHF with Cardiomyopathy- As seen on Echo- EF- 15%. Pt reoprts he has been told he has cardiomyopathy in the past. Cardiology was consulted, and Recs were that pt was obviously not a candidate for any aggressive evaluation for his cardiac status at least at this point, given his of blood pressure, would also not recommend any aggressive vasodilator therapy. In a patient with severe LV dysfunction, calcium channel blocker would probably be contraindicated. Pt was therefore started on digoxin 0.0625mg  daily.  # Protein-calorie malnutrition, severe- Pt reported weightloss and poor appetite. Nutrition was consulted, Pt was placed on feeding suppliment. Rameron 7.5mg  dly was also started to help with pt appetite and improve his mood.  # Thrombocytopenia- Also Pts platelet began to drop- 11/26/13- Pt count- 104, and dropped gradually to 76 11/29/2013, but on the day of discharge Platelet- 77. There was low clinical index of suspicion for HIT, with low probability. Possibly due to Medication side effect- Digoxin. Continue to monitor as an out-pt.  #CKD3?- Cr 1.39 on admission, 1.34 today (baseline unknown). He does report a history of kidney disease.   Discharge Vitals:   BP 93/58  Pulse 79  Temp(Src) 98 F (36.7 C) (Oral)  Resp 16  Ht 6\' 3"  (1.905 m)  Wt 175 lb 14.8 oz (79.8 kg)  BMI 21.99 kg/m2  SpO2 100%  Discharge Labs:  Results for orders placed during the hospital encounter of 11/23/13 (from the past 24 hour(s))  CBC      Status: Abnormal   Collection Time    11/30/13  5:04 AM      Result Value Range   WBC 5.6  4.0 - 10.5 K/uL   RBC 3.91 (*) 4.22 - 5.81 MIL/uL   Hemoglobin 13.4  13.0 - 17.0 g/dL   HCT 16.1  09.6 - 04.5 %   MCV 104.3 (*) 78.0 - 100.0 fL   MCH 34.3 (*) 26.0 - 34.0 pg   MCHC 32.8  30.0 - 36.0 g/dL   RDW 40.9 (*) 81.1 - 91.4 %   Platelets 77 (*) 150 - 400 K/uL  BASIC METABOLIC PANEL     Status: Abnormal   Collection Time    11/30/13  5:04 AM      Result Value Range   Sodium 138  135 - 145 mEq/L   Potassium 5.1  3.5 - 5.1 mEq/L   Chloride 102  96 - 112 mEq/L   CO2 22  19 - 32 mEq/L   Glucose, Bld 112 (*) 70 - 99 mg/dL   BUN 69 (*) 6 - 23 mg/dL   Creatinine, Ser 7.82 (*) 0.50 - 1.35 mg/dL   Calcium 8.7  8.4 - 95.6 mg/dL   GFR calc non Af Amer 50 (*) >90 mL/min   GFR calc Af Amer 58 (*) >90 mL/min  PROTIME-INR     Status: Abnormal   Collection Time    11/30/13  5:04 AM      Result Value Range   Prothrombin Time 21.7 (*) 11.6 - 15.2 seconds   INR 1.96 (*) 0.00 - 1.49    Signed: Kennis Carina, MD 11/30/2013, 1:13 PM   Time Spent on Discharge: 40 minutes Services Ordered on Discharge: Will require PT/Ot and nutritionist input. Equipment Ordered on Discharge: None.

## 2013-11-30 NOTE — Progress Notes (Addendum)
CSW received voicemail from pt's sister stating facility needs insurance cards and they are with pt's friend. CSW called financial services in hospital and requested they be faxed to [facility]. Hospital is doing this. CSW called facility and requested they call CSW in the event card information is illegible and facility needs hard copies of card. If this is the case, facility will get cards from pt's sister. CSW also provided phone number of pt's new CSW to facility, and this CSW is signing off.  Addendum: CSW received call from [facility] that they had not received fax with pt's insurance cards via fax. CSW called financial services in hospital and explained that facility has not received fax--employee explained that another company faxes documents and this can take up to 48 hours, but employee in hospital will pull the documents and fax them to facility. CSW thanked hospital provider and called facility back explaining that they should receive the fax shortly. CSW explained that facility can call her if they do not receive the fax. CSW re-signing off. Should CSW needs arise, please contact unit social worker Cassandra at [phone].   Maryclare Labrador, MSW, [facility] Clinical Social Worker [phone]

## 2013-12-03 ENCOUNTER — Telehealth: Payer: Self-pay | Admitting: Internal Medicine

## 2013-12-03 NOTE — Telephone Encounter (Signed)
   Reason for call:   I received a call from Mr. [name] from his sister Irwin Brakeman at 930  PM indicating that her brother was crying and upset over hearing that he was told that he has "PNA" at [facility]. She wanted to be reassured that he was doing well. Her main concerns are whether she put him in the "right nursing home." She was told that she could bring up any concerns with Dr. Rogelia Boga. She would greatly like the opportunity to speak with Dr. Rogelia Boga regarding his placement as she lives in [place].     She states that he was only complaining of increased phelgm (clear w/o blood or color) not having fevers, hypoxia as reported by the nursing staff at the home. No increased SOB, or CP, no hemoptysis, weakness, HA, subjective fevers/chills, n/v/d. Just very emotional and calling her crying. She mentioned she fears he has "autism spectrum disorder" and has not been able to fully take care of himself.    Pertinent Data:   Reviewed d/c summary and pertinent medications with her   Assessment / Plan / Recommendations:   The number she would like to be reached is: [phone]  She was reassured and educated on warning signs of PNA vs expanding PE and that she should help facilitate her brothers presentation to the ED  In regards to the phlegm suggestion of tessalon perels vs mucinex but she refused to have prescriptions and said would discuss with patient.   She was informed that this message would be forwarded to Dr. Rogelia Boga.    Christen Bame, MD   12/03/2013, 9:29 PM

## 2013-12-05 NOTE — Telephone Encounter (Signed)
I called pt's sister today at 2:45 and got voice mail. I left message.

## 2013-12-13 ENCOUNTER — Encounter (HOSPITAL_COMMUNITY): Payer: Self-pay | Admitting: Emergency Medicine

## 2013-12-13 ENCOUNTER — Emergency Department (HOSPITAL_COMMUNITY): Payer: Medicare Other

## 2013-12-13 ENCOUNTER — Inpatient Hospital Stay (HOSPITAL_COMMUNITY)
Admission: EM | Admit: 2013-12-13 | Discharge: 2013-12-17 | DRG: 811 | Disposition: A | Discharge status: Skilled Nursing Facility | Payer: Medicare Other | Attending: Internal Medicine | Admitting: Internal Medicine

## 2013-12-13 DIAGNOSIS — D62 Acute posthemorrhagic anemia: Principal | ICD-10-CM

## 2013-12-13 DIAGNOSIS — Z823 Family history of stroke: Secondary | ICD-10-CM

## 2013-12-13 DIAGNOSIS — Z8673 Personal history of transient ischemic attack (TIA), and cerebral infarction without residual deficits: Secondary | ICD-10-CM

## 2013-12-13 DIAGNOSIS — I5022 Chronic systolic (congestive) heart failure: Secondary | ICD-10-CM

## 2013-12-13 DIAGNOSIS — Z7982 Long term (current) use of aspirin: Secondary | ICD-10-CM

## 2013-12-13 DIAGNOSIS — Z86711 Personal history of pulmonary embolism: Secondary | ICD-10-CM

## 2013-12-13 DIAGNOSIS — E876 Hypokalemia: Secondary | ICD-10-CM | POA: Diagnosis present

## 2013-12-13 DIAGNOSIS — D696 Thrombocytopenia, unspecified: Secondary | ICD-10-CM

## 2013-12-13 DIAGNOSIS — R040 Epistaxis: Secondary | ICD-10-CM

## 2013-12-13 DIAGNOSIS — I959 Hypotension, unspecified: Secondary | ICD-10-CM

## 2013-12-13 DIAGNOSIS — I509 Heart failure, unspecified: Secondary | ICD-10-CM

## 2013-12-13 DIAGNOSIS — D500 Iron deficiency anemia secondary to blood loss (chronic): Secondary | ICD-10-CM | POA: Diagnosis present

## 2013-12-13 DIAGNOSIS — R627 Adult failure to thrive: Secondary | ICD-10-CM | POA: Diagnosis present

## 2013-12-13 DIAGNOSIS — D6832 Hemorrhagic disorder due to extrinsic circulating anticoagulants: Secondary | ICD-10-CM

## 2013-12-13 DIAGNOSIS — R7989 Other specified abnormal findings of blood chemistry: Secondary | ICD-10-CM

## 2013-12-13 DIAGNOSIS — IMO0002 Reserved for concepts with insufficient information to code with codable children: Secondary | ICD-10-CM

## 2013-12-13 DIAGNOSIS — R634 Abnormal weight loss: Secondary | ICD-10-CM

## 2013-12-13 DIAGNOSIS — Z7901 Long term (current) use of anticoagulants: Secondary | ICD-10-CM

## 2013-12-13 DIAGNOSIS — R0902 Hypoxemia: Secondary | ICD-10-CM | POA: Diagnosis present

## 2013-12-13 DIAGNOSIS — E43 Unspecified severe protein-calorie malnutrition: Secondary | ICD-10-CM

## 2013-12-13 DIAGNOSIS — I2699 Other pulmonary embolism without acute cor pulmonale: Secondary | ICD-10-CM

## 2013-12-13 DIAGNOSIS — R64 Cachexia: Secondary | ICD-10-CM | POA: Diagnosis present

## 2013-12-13 DIAGNOSIS — R791 Abnormal coagulation profile: Secondary | ICD-10-CM

## 2013-12-13 DIAGNOSIS — Z87891 Personal history of nicotine dependence: Secondary | ICD-10-CM

## 2013-12-13 DIAGNOSIS — T502X5A Adverse effect of carbonic-anhydrase inhibitors, benzothiadiazides and other diuretics, initial encounter: Secondary | ICD-10-CM | POA: Diagnosis present

## 2013-12-13 HISTORY — DX: Cerebral infarction, unspecified: I63.9

## 2013-12-13 HISTORY — DX: Personal history of other venous thrombosis and embolism: Z86.718

## 2013-12-13 LAB — CBC WITH DIFFERENTIAL/PLATELET
Basophils Absolute: 0 10*3/uL (ref 0.0–0.1)
Basophils Relative: 0 % (ref 0–1)
Eosinophils Relative: 0 % (ref 0–5)
HCT: 39.1 % (ref 39.0–52.0)
Lymphocytes Relative: 20 % (ref 12–46)
Lymphs Abs: 1.3 10*3/uL (ref 0.7–4.0)
MCH: 34.7 pg — ABNORMAL HIGH (ref 26.0–34.0)
MCV: 107.7 fL — ABNORMAL HIGH (ref 78.0–100.0)
Monocytes Absolute: 0.4 10*3/uL (ref 0.1–1.0)
RBC: 3.63 MIL/uL — ABNORMAL LOW (ref 4.22–5.81)
RDW: 19.2 % — ABNORMAL HIGH (ref 11.5–15.5)
WBC: 6.4 10*3/uL (ref 4.0–10.5)

## 2013-12-13 LAB — CBC
HCT: 29.9 % — ABNORMAL LOW (ref 39.0–52.0)
Hemoglobin: 9.6 g/dL — ABNORMAL LOW (ref 13.0–17.0)
MCV: 106 fL — ABNORMAL HIGH (ref 78.0–100.0)
Platelets: 88 10*3/uL — ABNORMAL LOW (ref 150–400)
RBC: 2.82 MIL/uL — ABNORMAL LOW (ref 4.22–5.81)
RDW: 19 % — ABNORMAL HIGH (ref 11.5–15.5)
WBC: 6.5 10*3/uL (ref 4.0–10.5)

## 2013-12-13 LAB — POCT I-STAT 3, ART BLOOD GAS (G3+)
Acid-base deficit: 1 mmol/L (ref 0.0–2.0)
Bicarbonate: 24.7 mEq/L — ABNORMAL HIGH (ref 20.0–24.0)
Patient temperature: 97.9
TCO2: 26 mmol/L (ref 0–100)
pH, Arterial: 7.37 (ref 7.350–7.450)
pO2, Arterial: 60 mmHg — ABNORMAL LOW (ref 80.0–100.0)

## 2013-12-13 LAB — BASIC METABOLIC PANEL
CO2: 28 mEq/L (ref 19–32)
Calcium: 7.8 mg/dL — ABNORMAL LOW (ref 8.4–10.5)
Creatinine, Ser: 0.97 mg/dL (ref 0.50–1.35)
Glucose, Bld: 120 mg/dL — ABNORMAL HIGH (ref 70–99)
Sodium: 138 mEq/L (ref 135–145)

## 2013-12-13 LAB — PRO B NATRIURETIC PEPTIDE: Pro B Natriuretic peptide (BNP): 33876 pg/mL — ABNORMAL HIGH (ref 0–125)

## 2013-12-13 LAB — GLUCOSE, CAPILLARY
Glucose-Capillary: 101 mg/dL — ABNORMAL HIGH (ref 70–99)
Glucose-Capillary: 77 mg/dL (ref 70–99)
Glucose-Capillary: 88 mg/dL (ref 70–99)

## 2013-12-13 LAB — CG4 I-STAT (LACTIC ACID): Lactic Acid, Venous: 2.95 mmol/L — ABNORMAL HIGH (ref 0.5–2.2)

## 2013-12-13 LAB — APTT: aPTT: 83 seconds — ABNORMAL HIGH (ref 24–37)

## 2013-12-13 LAB — ABO/RH: ABO/RH(D): A POS

## 2013-12-13 LAB — PROTIME-INR: INR: 1.82 — ABNORMAL HIGH (ref 0.00–1.49)

## 2013-12-13 LAB — MRSA PCR SCREENING: MRSA by PCR: NEGATIVE

## 2013-12-13 MED ORDER — SILVER NITRATE-POT NITRATE 75-25 % EX MISC
10.0000 | Freq: Once | CUTANEOUS | Status: AC
  Administered 2013-12-13: 10 via TOPICAL
  Filled 2013-12-13: qty 10

## 2013-12-13 MED ORDER — ONDANSETRON HCL 4 MG/2ML IJ SOLN: 4.0000 mg | Freq: Four times a day (QID) | INTRAMUSCULAR | Status: DC | PRN

## 2013-12-13 MED ORDER — VITAMIN K1 10 MG/ML IJ SOLN
5.0000 mg | Freq: Once | INTRAVENOUS | Status: AC
  Administered 2013-12-13: 5 mg via INTRAVENOUS
  Filled 2013-12-13 (×2): qty 0.5

## 2013-12-13 MED ORDER — PANTOPRAZOLE SODIUM 40 MG IV SOLR
40.0000 mg | Freq: Every day | INTRAVENOUS | Status: DC
  Administered 2013-12-13 – 2013-12-14 (×2): 40 mg via INTRAVENOUS
  Filled 2013-12-13 (×3): qty 40

## 2013-12-13 MED ORDER — SODIUM CHLORIDE 0.9 % IV SOLN
INTRAVENOUS | Status: DC
  Administered 2013-12-13: 14:00:00 via INTRAVENOUS

## 2013-12-13 MED ORDER — QUETIAPINE FUMARATE 50 MG PO TABS
50.0000 mg | ORAL_TABLET | Freq: Every day | ORAL | Status: DC
Start: 2013-12-13 — End: 2013-12-17
  Administered 2013-12-13 – 2013-12-16 (×4): 50 mg via ORAL
  Filled 2013-12-13 (×5): qty 1

## 2013-12-13 MED ORDER — COCAINE HCL 4 % EX SOLN
4.0000 mL | Freq: Once | CUTANEOUS | Status: AC
  Administered 2013-12-13: 4 mL via NASAL
  Filled 2013-12-13: qty 4

## 2013-12-13 MED ORDER — FLUOXETINE HCL 20 MG PO CAPS
20.0000 mg | ORAL_CAPSULE | Freq: Every day | ORAL | Status: DC
  Administered 2013-12-13 – 2013-12-17 (×5): 20 mg via ORAL
  Filled 2013-12-13 (×5): qty 1

## 2013-12-13 MED ORDER — SODIUM CHLORIDE 0.9 % IV SOLN: 250.0000 mL | INTRAVENOUS | Status: DC | PRN

## 2013-12-13 MED ORDER — SODIUM CHLORIDE 0.9 % IV BOLUS (SEPSIS)
250.0000 mL | Freq: Once | INTRAVENOUS | Status: AC
  Administered 2013-12-13: 250 mL via INTRAVENOUS

## 2013-12-13 NOTE — Progress Notes (Signed)
Chaplain was ministry rounding and came across pt in trauma c bay of ED.  Chaplain had met with pt and sister in the past pertaining to AD information.  Chaplain stopped in to talk with pt about why he was here and if he was able to touch base with his sister on his whereabouts.  Pt confirmed that his sister was on her way.  Chaplain ended the visit soon thereafter and the pt seemed grateful for the ministry of presence.
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
## 2013-12-13 NOTE — ED Notes (Signed)
Pt from [facility].EMS arrived at facility after receiving a call that patient was SOB and coughing up blood. EMS arrived and pt was coughing up thick clots. EMS auscultated rales in the left lungs fields. Pt started on CPAP. Pt given nitro x 1 in route and 4 mg of Zofran for nausea related to blood in stomach. ST on EKG according to EMS. Vitals: HR- 110 BP- 112/76 SPO2- 84% RA 20 g RAC. Hx CHF, Pulmonary Edema, and clots

## 2013-12-13 NOTE — ED Notes (Signed)
Pt has hx of positive PE 11 days ago.

## 2013-12-13 NOTE — ED Notes (Signed)
250 bolus completed, but BP is 87/51. Fluids kept wide open to maintain BP above 90.

## 2013-12-13 NOTE — ED Notes (Signed)
Pt is on coumadin and Lovenox

## 2013-12-13 NOTE — ED Notes (Signed)
Pt placed on Zoll due to runs of Vtach .
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
## 2013-12-13 NOTE — ED Notes (Signed)
Critical Care NP at bedside 

## 2013-12-13 NOTE — ED Notes (Signed)
Pt resituated on stretcher; zoll pads placed on pt; myself, Verlon Au, RN and [employer], RN cleaned pt and placed clean gown on pt, chuk across his chest and a clean yankauer for pt to use when needed

## 2013-12-13 NOTE — ED Notes (Signed)
MD made aware of BP, no further orders received. Continue to monitor.

## 2013-12-13 NOTE — ED Notes (Signed)
Pt alert and oriented. Pt is able to use suction to clear his own airway.
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
## 2013-12-13 NOTE — ED Notes (Signed)
istat lactic acid shown to dr Blinda Leatherwood and Verlon Au, rn

## 2013-12-14 ENCOUNTER — Inpatient Hospital Stay (HOSPITAL_COMMUNITY): Payer: Medicare Other

## 2013-12-14 DIAGNOSIS — R799 Abnormal finding of blood chemistry, unspecified: Secondary | ICD-10-CM

## 2013-12-14 DIAGNOSIS — I5022 Chronic systolic (congestive) heart failure: Secondary | ICD-10-CM

## 2013-12-14 DIAGNOSIS — I509 Heart failure, unspecified: Secondary | ICD-10-CM

## 2013-12-14 LAB — CBC
Hemoglobin: 8.5 g/dL — ABNORMAL LOW (ref 13.0–17.0)
MCH: 35.1 pg — ABNORMAL HIGH (ref 26.0–34.0)
Platelets: 68 10*3/uL — ABNORMAL LOW (ref 150–400)
RBC: 2.42 MIL/uL — ABNORMAL LOW (ref 4.22–5.81)
WBC: 4.2 10*3/uL (ref 4.0–10.5)

## 2013-12-14 LAB — PREPARE FRESH FROZEN PLASMA
Unit division: 0
Unit division: 0
Unit division: 0
Unit division: 0

## 2013-12-14 LAB — BASIC METABOLIC PANEL
CO2: 27 mEq/L (ref 19–32)
Calcium: 7.8 mg/dL — ABNORMAL LOW (ref 8.4–10.5)
Chloride: 107 mEq/L (ref 96–112)
Glucose, Bld: 72 mg/dL (ref 70–99)
Potassium: 4.4 mEq/L (ref 3.5–5.1)
Sodium: 141 mEq/L (ref 135–145)

## 2013-12-14 LAB — PROTIME-INR
INR: 1.52 — ABNORMAL HIGH (ref 0.00–1.49)
Prothrombin Time: 17.9 seconds — ABNORMAL HIGH (ref 11.6–15.2)

## 2013-12-14 LAB — GLUCOSE, CAPILLARY: Glucose-Capillary: 78 mg/dL (ref 70–99)

## 2013-12-14 MED ORDER — DIGOXIN 62.5 MCG PO TABS: 62.5000 ug | ORAL_TABLET | Freq: Every day | ORAL | Status: DC

## 2013-12-14 MED ORDER — MIRTAZAPINE 7.5 MG PO TABS
7.5000 mg | ORAL_TABLET | Freq: Every day | ORAL | Status: DC
  Administered 2013-12-14 – 2013-12-16 (×3): 7.5 mg via ORAL
  Filled 2013-12-14 (×4): qty 1

## 2013-12-14 MED ORDER — DIGOXIN 0.0625 MG HALF TABLET
0.0625 mg | ORAL_TABLET | Freq: Every day | ORAL | Status: DC
  Administered 2013-12-14 – 2013-12-17 (×4): 0.0625 mg via ORAL
  Filled 2013-12-14 (×5): qty 1

## 2013-12-14 MED ORDER — FUROSEMIDE 10 MG/ML IJ SOLN
40.0000 mg | Freq: Two times a day (BID) | INTRAMUSCULAR | Status: AC
  Administered 2013-12-14 – 2013-12-16 (×4): 40 mg via INTRAVENOUS
  Filled 2013-12-14 (×4): qty 4
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
## 2013-12-14 NOTE — Progress Notes (Signed)
PULMONARY  / CRITICAL CARE MEDICINE  Name: Chase [name] MRN: [id number] DOB: 1941/12/25    ADMISSION DATE:  12/13/2013  REFERRING MD :  Dr. Blinda Leatherwood PRIMARY SERVICE: PCCM  CHIEF COMPLAINT:  Epistaxis / Hemoptysis on Coumadin / Lovenox  BRIEF PATIENT DESCRIPTION: 72 y/o M admitted with epistaxis / hemoptysis in setting of supratherapeutic INR (rx for PE)  SIGNIFICANT EVENTS / STUDIES:  12/16 - Admit with epistaxis / hemoptysis, supratherapeutic INR (rx for PE) 12/17- no further bleeding, ENT treated nose  LINES / TUBES:   CULTURES:   ANTIBIOTICS:  SUBJECTIVE: no bleeding further  VITAL SIGNS: Temp:  [97.7 F (36.5 C)-98.4 F (36.9 C)] 97.8 F (36.6 C) (12/17 1118) Pulse Rate:  [27-94] 74 (12/17 0830) Resp:  [12-23] 14 (12/17 0830) BP: (80-112)/(41-80) 88/57 mmHg (12/17 0830) SpO2:  [95 %-100 %] 98 % (12/17 0830) Weight:  [81.5 kg (179 lb 10.8 oz)] 81.5 kg (179 lb 10.8 oz) (12/16 1500)  HEMODYNAMICS:    VENTILATOR SETTINGS:    INTAKE / OUTPUT: Intake/Output     12/16 0701 - 12/17 0700 12/17 0701 - 12/18 0700   I.V. (mL/kg) 851.7 (10.5)    Blood 1584.7    IV Piggyback 50    Total Intake(mL/kg) 2486.3 (30.5)    Urine (mL/kg/hr) 200    Other 200    Total Output 400     Net +2086.3           PHYSICAL EXAMINATION: General:  Thin / cachectic adult male in NAD Neuro:  AAOx4, speech clear, MAE HEENT:  No bleedingfrom nares Cardiovascular:  s1s2 rrr Lungs:  No distress, coarse bases Abdomen:  Round/soft, bsx4 active Musculoskeletal:  No acute deformities, chronic changes to L side Skin:  Warm/dry, LE edema 2-3+ pitting  LABS:  CBC  Recent Labs Lab 12/13/13 0926 12/13/13 1815 12/14/13 0354  WBC 6.4 6.5 4.2  HGB 12.6* 9.6* 8.5*  HCT 39.1 29.9* 25.6*  PLT 109* 88* 68*   Coag's  Recent Labs Lab 12/13/13 0926 12/13/13 1815 12/14/13 0354  APTT 83*  --   --   INR 4.10* 1.82* 1.52*   BMET  Recent Labs Lab 12/13/13 0926 12/14/13 0354   NA 138 141  K 4.2 4.4  CL 104 107  CO2 28 27  BUN 31* 39*  CREATININE 0.97 0.81  GLUCOSE 120* 72   Electrolytes  Recent Labs Lab 12/13/13 0926 12/14/13 0354  CALCIUM 7.8* 7.8*   Sepsis Markers  Recent Labs Lab 12/13/13 0948  LATICACIDVEN 2.95*   ABG  Recent Labs Lab 12/13/13 0921  PHART 7.370  PCO2ART 42.5  PO2ART 60.0*   Liver Enzymes No results found for this basename: AST, ALT, ALKPHOS, BILITOT, ALBUMIN,  in the last 168 hours Cardiac Enzymes  Recent Labs Lab 12/13/13 0926  TROPONINI <0.30  PROBNP 33876.0*   Glucose  Recent Labs Lab 12/13/13 1545 12/13/13 1906 12/13/13 2342 12/14/13 0403 12/14/13 0759  GLUCAP 101* 88 77 78 80    Imaging Dg Chest Port 1 View  12/14/2013   CLINICAL DATA:  Epistaxis; consolidation  EXAM: PORTABLE CHEST - 1 VIEW  COMPARISON:  December 13, 2013  FINDINGS: Generalized cardiomegaly is again noted. There is pulmonary venous hypertension. There is interstitial and patchy alveolar edema with bilateral effusions. There is no new appreciable opacity. No pneumothorax. No adenopathy.  IMPRESSION: Evidence of congestive heart failure, stable.  No new opacity.   Electronically Signed   By: Bretta Bang M.D.   On: 12/14/2013  07:39   Dg Chest Port 1 View  12/13/2013   CLINICAL DATA:  Shortness of breath, hemoptysis  EXAM: PORTABLE CHEST - 1 VIEW  COMPARISON:  11/29/2013  FINDINGS: Cardiomegaly. Central mild vascular congestion and mild perihilar interstitial prominence suspicious for mild interstitial edema. There is right small pleural effusion with right lower lobe atelectasis or infiltrate. Mild left basilar atelectasis or infiltrate.  IMPRESSION: Central mild vascular congestion and mild perihilar interstitial prominence suspicious for mild interstitial edema. There is right small pleural effusion with right lower lobe atelectasis or infiltrate. Mild left basilar atelectasis or infiltrate.   Electronically Signed   By: Natasha Mead M.D.   On: 12/13/2013 10:04    ASSESSMENT / PLAN:  PULMONARY A: Hypoxemia - as noted on ABG.  Last admit, question if was supposed to be on O2 at d/c At Risk Aspiration - in setting of epistaxis  Pulmonary Embolism - massive R PE, second occurrence.  Dx 11/26 admit.  Unclear why maintained on lovenox + coumadin beyond overlap period Epistaxis- resolved P:   -aspiration precautions -oxygen to support sats > 92% -as was still on coum AND Lovenox and had bleed and was treated and was super there, consider restart coumadin in am but NOT both  CARDIOVASCULAR A:  CHF  Hypotension - with normal mental status, chart review notes this has been a chronic issue P:  -assess cortisol 18, but no pressors, no role stress steroids -NS at 50 to kvo tele  RENAL A:  Edema pulm, chf P:   -trend BMP in am  -kvo -after products may need lasix, failure on pcxr   GASTROINTESTINAL A:   Nausea - related to ingestion of blood P:   -PRN zofran -start diet -needs screening colonoscopy post d/c to rule out source of prior PE's as outpt  HEMATOLOGIC A:   Coagulopathy - in setting of lovenox / coumadin administration - resolved Chronic Macrocytosis Chronic Thrombocytopenia - platelets run 80's-100's from chart review.  Was seen by Dr. Rosie Fate ([facility]) last admit Epistaxis resolved Anemia, blood loss and dilution P:  -do not combine lov and coumadin -repeat CBC and coags in am,  -ENT Evaluation, appreciated -hold anti-coagulation, trend INR in am again, likely to start monotherapy coumadin in am   INFECTIOUS A:   No evidence acute issues P:   -monitor fever curve / leukocytosis  ENDOCRINE A:   No acute issues P:   -trend glucose on BMP  NEUROLOGIC A:   Failure to Thrive - concern for underlying malignancy P:   -supportive care -has seen Palliative Care in past, request full code  To floor  Mcarthur Rossetti. Tyson Alias, MD, FACP Pgr: [phone] [facility]

## 2013-12-14 NOTE — Progress Notes (Signed)
Notified [name], patient's sister, that patient will be transferred to a telemetry floor. Patient spoke to sister.  Chase [name]

## 2013-12-14 NOTE — Progress Notes (Signed)
Pharmacist Heart Failure Core Measure Documentation  Assessment: Chase [name] has an EF documented as 15% on 11/24/13 by ECHO.  Rationale: Heart failure patients with left ventricular systolic dysfunction (LVSD) and an EF < 40% should be prescribed an angiotensin converting enzyme inhibitor (ACEI) or angiotensin receptor blocker (ARB) at discharge unless a contraindication is documented in the medical record.  This patient is not currently on an ACEI or ARB for HF.  This note is being placed in the record in order to provide documentation that a contraindication to the use of these agents is present for this encounter.  ACE Inhibitor or Angiotensin Receptor Blocker is contraindicated (specify all that apply)  []   ACEI allergy AND ARB allergy []   Angioedema []   Moderate or severe aortic stenosis []   Hyperkalemia [x]   Hypotension []   Renal artery stenosis []   Worsening renal function, preexisting renal disease or dysfunction   Baldemar Friday 12/14/2013 1:35 PM

## 2013-12-15 ENCOUNTER — Inpatient Hospital Stay (HOSPITAL_COMMUNITY): Payer: Medicare Other

## 2013-12-15 DIAGNOSIS — E43 Unspecified severe protein-calorie malnutrition: Secondary | ICD-10-CM

## 2013-12-15 LAB — APTT: aPTT: 44 seconds — ABNORMAL HIGH (ref 24–37)

## 2013-12-15 LAB — BASIC METABOLIC PANEL
Calcium: 8.1 mg/dL — ABNORMAL LOW (ref 8.4–10.5)
GFR calc Af Amer: >90 mL/min (ref >90)
GFR calc non Af Amer: 88 mL/min — ABNORMAL LOW (ref >90)
Sodium: 141 mEq/L (ref 135–145)

## 2013-12-15 LAB — PHOSPHORUS: Phosphorus: 2.7 mg/dL (ref 2.3–4.6)

## 2013-12-15 LAB — CBC WITH DIFFERENTIAL/PLATELET
Basophils Relative: 0 % (ref 0–1)
Eosinophils Relative: 0 % (ref 0–5)
Lymphocytes Relative: 23 % (ref 12–46)
Monocytes Absolute: 0.3 10*3/uL (ref 0.1–1.0)
Monocytes Relative: 7 % (ref 3–12)
Neutrophils Relative %: 70 % (ref 43–77)
Platelets: 84 10*3/uL — ABNORMAL LOW (ref 150–400)
RBC: 2.65 MIL/uL — ABNORMAL LOW (ref 4.22–5.81)
WBC: 4.6 10*3/uL (ref 4.0–10.5)

## 2013-12-15 LAB — PROTIME-INR: Prothrombin Time: 15.1 seconds (ref 11.6–15.2)

## 2013-12-15 MED ORDER — PANTOPRAZOLE SODIUM 40 MG PO TBEC
40.0000 mg | DELAYED_RELEASE_TABLET | Freq: Every day | ORAL | Status: DC
  Administered 2013-12-15 – 2013-12-16 (×2): 40 mg via ORAL
  Filled 2013-12-15 (×2): qty 1

## 2013-12-15 NOTE — Progress Notes (Signed)
PULMONARY  / CRITICAL CARE MEDICINE  Name: Chase [name] MRN: [id number] DOB: [birth year]    ADMISSION DATE:  12/13/2013  REFERRING MD :  Dr. Blinda Leatherwood PRIMARY SERVICE: PCCM  CHIEF COMPLAINT:  Epistaxis / Hemoptysis on Coumadin / Lovenox  BRIEF PATIENT DESCRIPTION: 72 y/o M admitted with epistaxis / hemoptysis in setting of supratherapeutic INR (rx for PE)  SIGNIFICANT EVENTS / STUDIES:  12/16 - Admit with epistaxis / hemoptysis, supratherapeutic INR (rx for PE) 12/17- no further bleeding, ENT treated nose - silver nitrate   SUBJECTIVE:  Small amt hemoptysis this am.  Denies sob, cough.  No further epistaxis.   VITAL SIGNS: Temp:  [97.3 F (36.3 C)-98 F (36.7 C)] 97.3 F (36.3 C) (12/18 0426) Pulse Rate:  [60-80] 60 (12/18 0426) Resp:  [12-18] 18 (12/18 0426) BP: (88-121)/(45-98) 100/68 mmHg (12/18 0426) SpO2:  [94 %-100 %] 94 % (12/18 0426) Weight:  [177 lb 4 oz (80.4 kg)-177 lb 11.1 oz (80.6 kg)] 177 lb 11.1 oz (80.6 kg) (12/18 0426) INTAKE / OUTPUT: Intake/Output     12/17 0701 - 12/18 0700 12/18 0701 - 12/19 0700   P.O. 300 380   I.V. (mL/kg) 659 (8.2)    Blood     IV Piggyback     Total Intake(mL/kg) 959 (11.9) 380 (4.7)   Urine (mL/kg/hr) 4925 (2.5)    Other     Total Output 4925     Net -3966 +380        Urine Occurrence 1 x     PHYSICAL EXAMINATION: General:  Thin / cachectic adult male in NAD Neuro:  AAOx4, speech clear, MAE HEENT:  No bleeding from nares, mm moist Cardiovascular:  s1s2 rrr Lungs:  No distress, coarse bases Abdomen:  Round/soft, bsx4 active Musculoskeletal:  No acute deformities, chronic changes to L side Skin:  Warm/dry, 1-2+ BLE edema   LABS:  CBC  Recent Labs Lab 12/13/13 1815 12/14/13 0354 12/15/13 0445  WBC 6.5 4.2 4.6  HGB 9.6* 8.5* 9.3*  HCT 29.9* 25.6* 29.7*  PLT 88* 68* 84*   Coag's  Recent Labs Lab 12/13/13 0926 12/13/13 1815 12/14/13 0354 12/15/13 0445  APTT 83*  --   --  44*  INR 4.10* 1.82* 1.52*  1.22   BMET  Recent Labs Lab 12/13/13 0926 12/14/13 0354 12/15/13 0445  NA 138 141 141  K 4.2 4.4 3.8  CL 104 107 105  CO2 28 27 30   BUN 31* 39* 30*  CREATININE 0.97 0.81 0.80  GLUCOSE 120* 72 74   Electrolytes  Recent Labs Lab 12/13/13 0926 12/14/13 0354 12/15/13 0445  CALCIUM 7.8* 7.8* 8.1*  MG  --   --  1.6  PHOS  --   --  2.7   Sepsis Markers  Recent Labs Lab 12/13/13 0948  LATICACIDVEN 2.95*   ABG  Recent Labs Lab 12/13/13 0921  PHART 7.370  PCO2ART 42.5  PO2ART 60.0*   Cardiac Enzymes  Recent Labs Lab 12/13/13 0926  TROPONINI <0.30  PROBNP 33876.0*   Glucose  Recent Labs Lab 12/13/13 1545 12/13/13 1906 12/13/13 2342 12/14/13 0403 12/14/13 0759  GLUCAP 101* 88 77 78 80    Imaging Dg Chest Port 1 View  12/15/2013   CLINICAL DATA:  Congestive heart failure.  EXAM: PORTABLE CHEST - 1 VIEW  COMPARISON:  12/14/2013.  FINDINGS: The cardiopericardial silhouette remains enlarged. There are bilateral pleural effusions, greater on the right than left. Right effusion layers laterally at the base. Pulmonary edema is  present which is unchanged compared to prior. Monitoring leads project over the chest.  IMPRESSION: Unchanged moderate CHF.   Electronically Signed   By: Andreas Newport M.D.   On: 12/15/2013 07:08   Dg Chest Port 1 View  12/14/2013   CLINICAL DATA:  Epistaxis; consolidation  EXAM: PORTABLE CHEST - 1 VIEW  COMPARISON:  December 13, 2013  FINDINGS: Generalized cardiomegaly is again noted. There is pulmonary venous hypertension. There is interstitial and patchy alveolar edema with bilateral effusions. There is no new appreciable opacity. No pneumothorax. No adenopathy.  IMPRESSION: Evidence of congestive heart failure, stable.  No new opacity.   Electronically Signed   By: Bretta Bang M.D.   On: 12/14/2013 07:39    ASSESSMENT / PLAN:  PULMONARY A: Hypoxemia - as noted on ABG.  Last admit, question if was supposed to be on O2 at  d/c Pulmonary Embolism - massive R PE, second occurrence.  Dx 11/26 admit.  Unclear why maintained on lovenox + coumadin beyond overlap period Epistaxis- resolved P:   -oxygen to support sats > 92% -resume anti-coagulation when bleeding issue resolved -monitor closely for s/s bleeding  -F/u CXR intermittently  CARDIOVASCULAR A:  CHF  Hypotension - with normal mental status, likely baseline P:  Continue digoxin, gentle diuresis   RENAL A:   Mild volume overload  P:   -f/u chem   GASTROINTESTINAL A:   Nausea - related to ingestion of blood. Resolved.  P:   -PRN zofran -tolerating PO diet -needs screening colonoscopy post d/c to rule out source of prior PE's as outpt  HEMATOLOGIC A:   Coagulopathy - in setting of lovenox / coumadin administration - resolved Chronic Macrocytosis Chronic Thrombocytopenia - platelets run 80's-100's from chart review.  Was seen by Dr. Rosie Fate ([facility]) last admit Epistaxis resolved Anemia, blood loss and dilution P:  -do not combine lov and coumadin -repeat CBC and coags  -hold anti-coagulation again 12/18 with small amt hemoptysis this am - consider resume monotherapy coumadin 12/19 if no further bleeding    NEUROLOGIC A:   Failure to Thrive - concern for underlying malignancy P:   -supportive care -has seen Palliative Care in past, requests full code -further w/u as outpt   Dispo - from [facility].  Wants to return.  SW consult ordered.    Will ask [facility] to assume care 12/18.    Danford Bad, NP 12/15/2013  10:16 AM Pager: [phone] or [phone]  Reviewed above, examined pt, and agree with above.  Bleeding seems to be resolving.  Will need to resume coumadin soon.  Will transfer to [facility] 12/19 and PCCM sign off.  Coralyn Helling, MD [facility] Pulmonary/Critical Care 12/15/2013, 12:08 PM Pager:  [phone] After 3pm call: [phone]

## 2013-12-15 NOTE — Progress Notes (Signed)
Pt. Continued to have 3-4 beats of VTach occasionally throughout the shift but pt. Was asymptomatic and no complaints of Chest pain or shortness of breath. Pt. Denied complaints of discomfort of distress throughout the shift.

## 2013-12-16 ENCOUNTER — Encounter (HOSPITAL_COMMUNITY): Payer: Self-pay | Admitting: Emergency Medicine

## 2013-12-16 DIAGNOSIS — Z86718 Personal history of other venous thrombosis and embolism: Secondary | ICD-10-CM

## 2013-12-16 DIAGNOSIS — D62 Acute posthemorrhagic anemia: Secondary | ICD-10-CM | POA: Diagnosis present

## 2013-12-16 DIAGNOSIS — R634 Abnormal weight loss: Secondary | ICD-10-CM | POA: Diagnosis present

## 2013-12-16 DIAGNOSIS — R0902 Hypoxemia: Secondary | ICD-10-CM | POA: Diagnosis present

## 2013-12-16 DIAGNOSIS — D6832 Hemorrhagic disorder due to extrinsic circulating anticoagulants: Secondary | ICD-10-CM

## 2013-12-16 DIAGNOSIS — E876 Hypokalemia: Secondary | ICD-10-CM | POA: Diagnosis present

## 2013-12-16 HISTORY — DX: Personal history of other venous thrombosis and embolism: Z86.718

## 2013-12-16 LAB — BASIC METABOLIC PANEL
CO2: 32 mEq/L (ref 19–32)
Calcium: 8 mg/dL — ABNORMAL LOW (ref 8.4–10.5)
Chloride: 100 mEq/L (ref 96–112)
Creatinine, Ser: 0.78 mg/dL (ref 0.50–1.35)
GFR calc non Af Amer: 89 mL/min — ABNORMAL LOW (ref >90)
Glucose, Bld: 83 mg/dL (ref 70–99)
Sodium: 138 mEq/L (ref 135–145)

## 2013-12-16 LAB — PROTIME-INR
INR: 1.2 (ref 0.00–1.49)
Prothrombin Time: 14.9 seconds (ref 11.6–15.2)

## 2013-12-16 LAB — CBC
Hemoglobin: 9.3 g/dL — ABNORMAL LOW (ref 13.0–17.0)
MCH: 34.3 pg — ABNORMAL HIGH (ref 26.0–34.0)
MCHC: 32.3 g/dL (ref 30.0–36.0)
MCV: 106.3 fL — ABNORMAL HIGH (ref 78.0–100.0)
RBC: 2.71 MIL/uL — ABNORMAL LOW (ref 4.22–5.81)

## 2013-12-16 MED ORDER — BISACODYL 10 MG RE SUPP: 10.0000 mg | Freq: Every day | RECTAL | Status: DC | PRN

## 2013-12-16 MED ORDER — WARFARIN - PHARMACIST DOSING INPATIENT: Freq: Every day | Status: DC

## 2013-12-16 MED ORDER — ENOXAPARIN SODIUM 80 MG/0.8ML ~~LOC~~ SOLN
80.0000 mg | Freq: Two times a day (BID) | SUBCUTANEOUS | Status: DC
  Administered 2013-12-16 – 2013-12-17 (×2): 80 mg via SUBCUTANEOUS
  Filled 2013-12-16 (×3): qty 0.8

## 2013-12-16 MED ORDER — POLYETHYLENE GLYCOL 3350 17 G PO PACK
17.0000 g | PACK | Freq: Every day | ORAL | Status: DC
  Administered 2013-12-16 – 2013-12-17 (×2): 17 g via ORAL
  Filled 2013-12-16 (×3): qty 1

## 2013-12-16 MED ORDER — POTASSIUM CHLORIDE CRYS ER 20 MEQ PO TBCR: 20.0000 meq | EXTENDED_RELEASE_TABLET | Freq: Two times a day (BID) | ORAL | Status: DC

## 2013-12-16 MED ORDER — POTASSIUM CHLORIDE CRYS ER 20 MEQ PO TBCR: 20.0000 meq | EXTENDED_RELEASE_TABLET | Freq: Two times a day (BID) | ORAL | Status: AC

## 2013-12-16 MED ORDER — ENOXAPARIN SODIUM 80 MG/0.8ML ~~LOC~~ SOLN: 80.0000 mg | Freq: Two times a day (BID) | SUBCUTANEOUS | Status: DC

## 2013-12-16 MED ORDER — POTASSIUM CHLORIDE CRYS ER 20 MEQ PO TBCR
20.0000 meq | EXTENDED_RELEASE_TABLET | Freq: Two times a day (BID) | ORAL | Status: DC
  Administered 2013-12-16 – 2013-12-17 (×3): 20 meq via ORAL
  Filled 2013-12-16 (×4): qty 1

## 2013-12-16 MED ORDER — SODIUM CHLORIDE 0.9 % IJ SOLN
3.0000 mL | Freq: Two times a day (BID) | INTRAMUSCULAR | Status: DC
  Administered 2013-12-16 – 2013-12-17 (×3): 3 mL via INTRAVENOUS

## 2013-12-16 MED ORDER — WARFARIN SODIUM 5 MG PO TABS
5.0000 mg | ORAL_TABLET | Freq: Once | ORAL | Status: AC
  Administered 2013-12-16: 20:00:00 5 mg via ORAL
  Filled 2013-12-16: qty 1

## 2013-12-16 MED ORDER — ENOXAPARIN SODIUM 80 MG/0.8ML ~~LOC~~ SOLN
80.0000 mg | Freq: Two times a day (BID) | SUBCUTANEOUS | Status: DC
  Administered 2013-12-16: 14:00:00 80 mg via SUBCUTANEOUS
  Filled 2013-12-16 (×2): qty 0.8

## 2013-12-16 MED ORDER — WARFARIN SODIUM 5 MG PO TABS: 5.0000 mg | ORAL_TABLET | Freq: Every day | ORAL | Status: DC

## 2013-12-16 NOTE — Clinical Social Work Psychosocial (Signed)
     Clinical Social Work Department BRIEF PSYCHOSOCIAL ASSESSMENT 12/16/2013  Patient:  Chase [name], Chase [name]     Account Number:  [id number]     Admit date:  12/13/2013  Clinical Social Worker:  Tiburcio Pea  Date/Time:  12/16/2013 04:20 PM  Referred by:  Physician  Date Referred:  12/16/2013 Referred for  Other - See comment   Other Referral:   From SNF- return there   Interview type:  Other - See comment Other interview type:   Patient in person; sister [name]- via phone    PSYCHOSOCIAL DATA Living Status:  FACILITY Admitted from facility:  [facility] Level of care:  Skilled Nursing Facility Primary support name:  [name]  [phone] [facility] [phone] Primary support relationship to patient:  SIBLING Degree of support available:   Strong support    CURRENT CONCERNS Current Concerns  Other - See comment   Other Concerns:   Return to SNF    SOCIAL WORK ASSESSMENT / PLAN 72 year old male- resident of [facility]. Patient is most anxious to return to facility when medically stable. CSW spoke to Hardy- Admissions at [facility]. She stated that patient's bed remains open and she will accept back back when medically stable. Possible d/c tomorrow per MD; AVS sent to SNF for review- MD will complete d/c summary if stable Saturday.   Fl2 placed on chart for MD's signature.   Assessment/plan status:  Psychosocial Support/Ongoing Assessment of Needs Other assessment/ plan:   Information/referral to community resources:   None at this time.    PATIENTS/FAMILYS RESPONSE TO PLAN OF CARE: Pateint is alert, oriented and very pleasant. He and his sister both want him to return to SNF when medically stable per MD.  Patient was appreciative of CSW's visit and is relieved to know that he has not lost his bed at the SNF. Patient's sister verbalized the same feelings.  Will ask weekend CSW to follow up with MD and facility re: possible weekend  d/c.  Lorri Frederick. Doni Bacha, LCSWA  [phone]

## 2013-12-16 NOTE — Progress Notes (Signed)
[facility] PROGRESS NOTE   Chase [name] WUJ:[id number] DOB: March 08, 1941 DOA: 12/13/2013 PCP: Delorse Lek, MD  Brief narrative: Chase [name] is an 72 y.o. male with a PMH of pulmonary embolism, on chronic Coumadin and Lovenox therapy who was admitted on 12/13/2013 with epistaxis and a supratherapeutic INR.  Assessment/Plan: Principal Problem:   Acute blood loss anemia secondary to epistaxis in the setting of a supratherapeutic INR Secondary to epistaxis in the setting of a supratherapeutic INR which was treated with 4 units of fresh frozen plasma and 5 mg of vitamin K. Had a 4 g drop in his hemoglobin to a low of 8.3 mg/dL before beginning to trend up. Has not required a blood transfusion. Seen by ENT on 12/13/2013 with electrocautery performed achieving hemostasis. Followup ENT in 2 weeks. No further bleeding x48 hours. Active Problems:   Pulmonary embolus Anticoagulation currently on hold. Patient not interested in a IVC filter. Seen by hematologist on 11/24/2013 during prior admission with lifelong anticoagulation recommended. Remote history of PE with recent massive right PE 11/23/2013. Was apparently on both Lovenox and Coumadin beyond the overlap period. With stable hemoglobin, we'll cautiously resume anticoagulation per pharmacy.   Chronic systolic heart failure Continue digoxin and furosemide. Appears to be well compensated. Last echocardiogram done 11/24/2013 and showed an EF of 15% diffuse hypokinesis. Low blood pressure precludes starting an ACE/ARB at this time. Intake and output balance is negative by 4.5 L over the past 24 hours. Weight is significantly down from admission 81.5 kg---> 76.2 kg.   Protein-calorie malnutrition, severe / loss of weight Denies any history of having a colonoscopy. Unsure if his PCP has checked his PSA. At this time, he does not want a lot of testing and feels he has lived "a good life ". Of note, the patient was evaluated by Dr. Ladona Ridgel  from the palliative care team on 11/27/2013 and appeared to have capacity at that time.  Seen by dietitian 12/14/2013 with recommendations noted.   Thrombocytopenia, unspecified Appears to be somewhat chronic.   Hypotension, unspecified  Resolved with IV fluids.   Elevated INR / Warfarin-induced coagulopathy Reversed with fresh frozen plasma, 4 units, and vitamin K.   Hypokalemia Likely from Lasix. Supplementation ordered.   Hypoxemia Resolved with oxygen saturations currently 98-99%.  Code Status: Full code. Family Communication: Trevor Iha called at [phone], message left. Disposition Plan: SNF.   IV access:  Peripheral IV  Medical Consultants:  ENT  Pulmonology  Other Consultants:  Dietitian  Anti-infectives:  None.  HPI/Subjective: Chase [name] has not had any further nasal bleeding. He does have a dry mouth but denies dyspnea at rest. He does get some shortness of breath with exertion.  He tells me that he has never had a colonoscopy and is unsure if his PCP checks his PSA. He currently seems to be not interested in an aggressive evaluation for underlying malignancy.  Objective: Filed Vitals:   12/15/13 1957 12/16/13 0503 12/16/13 0950 12/16/13 0957  BP: 87/52 97/80  93/55  Pulse: 78 72  66  Temp: 97.4 F (36.3 C) 97.1 F (36.2 C)    TempSrc: Oral Oral    Resp: 18 18 18    Height:      Weight:  76.2 kg (167 lb 15.9 oz)    SpO2: 99% 98% 99%     Intake/Output Summary (Last 24 hours) at 12/16/13 1132 Last data filed at 12/16/13 1124  Gross per 24 hour  Intake   1242 ml  Output   5825 ml  Net  -4583 ml    Exam: Gen:  NAD Cardiovascular:  RRR, No M/R/G Respiratory:  Lungs sounds are diminished Gastrointestinal:  Abdomen soft, NT/ND, + BS Extremities:  No C/E/C  Data Reviewed: Basic Metabolic Panel:  Recent Labs Lab 12/13/13 0926 12/14/13 0354 12/15/13 0445 12/16/13 0535  NA 138 141 141 138  K 4.2 4.4 3.8 3.2*  CL 104 107 105 100   CO2 28 27 30  32  GLUCOSE 120* 72 74 83  BUN 31* 39* 30* 25*  CREATININE 0.97 0.81 0.80 0.78  CALCIUM 7.8* 7.8* 8.1* 8.0*  MG  --   --  1.6  --   PHOS  --   --  2.7  --    GFR Estimated Creatinine Clearance: 91.3 ml/min (by C-G formula based on Cr of 0.78). Liver Function Tests: No results found for this basename: AST, ALT, ALKPHOS, BILITOT, PROT, ALBUMIN,  in the last 168 hours No results found for this basename: LIPASE, AMYLASE,  in the last 168 hours No results found for this basename: AMMONIA,  in the last 168 hours Coagulation profile  Recent Labs Lab 12/13/13 0926 12/13/13 1815 12/14/13 0354 12/15/13 0445 12/16/13 0535  INR 4.10* 1.82* 1.52* 1.22 1.20    CBC:  Recent Labs Lab 12/13/13 0926 12/13/13 1815 12/14/13 0354 12/15/13 0445 12/16/13 0535  WBC 6.4 6.5 4.2 4.6 5.2  NEUTROABS 4.6  --   --  3.2  --   HGB 12.6* 9.6* 8.5* 9.3* 9.3*  HCT 39.1 29.9* 25.6* 29.7* 28.8*  MCV 107.7* 106.0* 105.8* 112.1* 106.3*  PLT 109* 88* 68* 84* 95*   Cardiac Enzymes:  Recent Labs Lab 12/13/13 0926  TROPONINI <0.30   BNP (last 3 results)  Recent Labs  11/23/13 1514 12/13/13 0926  PROBNP 37244.0* 33876.0*   CBG:  Recent Labs Lab 12/13/13 1545 12/13/13 1906 12/13/13 2342 12/14/13 0403 12/14/13 0759  GLUCAP 101* 88 77 78 80   Microbiology Recent Results (from the past 240 hour(s))  MRSA PCR SCREENING     Status: None   Collection Time    12/13/13  2:33 PM      Result Value Range Status   MRSA by PCR NEGATIVE  NEGATIVE Final   Comment:            The GeneXpert MRSA Assay (FDA     approved for NASAL specimens     only), is one component of a     comprehensive MRSA colonization     surveillance program. It is not     intended to diagnose MRSA     infection nor to guide or     monitor treatment for     MRSA infections.     Procedures and Diagnostic Studies: Dg Chest Port 1 View  12/15/2013   CLINICAL DATA:  Congestive heart failure.  EXAM:  PORTABLE CHEST - 1 VIEW  COMPARISON:  12/14/2013.  FINDINGS: The cardiopericardial silhouette remains enlarged. There are bilateral pleural effusions, greater on the right than left. Right effusion layers laterally at the base. Pulmonary edema is present which is unchanged compared to prior. Monitoring leads project over the chest.  IMPRESSION: Unchanged moderate CHF.   Electronically Signed   By: Andreas Newport M.D.   On: 12/15/2013 07:08   Dg Chest Port 1 View  12/14/2013   CLINICAL DATA:  Epistaxis; consolidation  EXAM: PORTABLE CHEST - 1 VIEW  COMPARISON:  December 13, 2013  FINDINGS: Generalized cardiomegaly  is again noted. There is pulmonary venous hypertension. There is interstitial and patchy alveolar edema with bilateral effusions. There is no new appreciable opacity. No pneumothorax. No adenopathy.  IMPRESSION: Evidence of congestive heart failure, stable.  No new opacity.   Electronically Signed   By: Bretta Bang M.D.   On: 12/14/2013 07:39   Dg Chest Port 1 View  12/13/2013   CLINICAL DATA:  Shortness of breath, hemoptysis  EXAM: PORTABLE CHEST - 1 VIEW  COMPARISON:  11/29/2013  FINDINGS: Cardiomegaly. Central mild vascular congestion and mild perihilar interstitial prominence suspicious for mild interstitial edema. There is right small pleural effusion with right lower lobe atelectasis or infiltrate. Mild left basilar atelectasis or infiltrate.  IMPRESSION: Central mild vascular congestion and mild perihilar interstitial prominence suspicious for mild interstitial edema. There is right small pleural effusion with right lower lobe atelectasis or infiltrate. Mild left basilar atelectasis or infiltrate.   Electronically Signed   By: Natasha Mead M.D.   On: 12/13/2013 10:04   Scheduled Meds: . digoxin  0.0625 mg Oral Daily  . FLUoxetine  20 mg Oral Daily  . mirtazapine  7.5 mg Oral QHS  . pantoprazole  40 mg Oral Daily  . polyethylene glycol  17 g Oral Daily  . potassium chloride  20  mEq Oral BID  . QUEtiapine  50 mg Oral QHS   Continuous Infusions:   Time spent: 35 minutes with > 50% of time discussing current diagnostic test results, clinical impression and plan of care.    LOS: 3 days   Love Chowning  [facility] Pager [phone].   *Please note that the hospitalists switch teams on Wednesdays. Please call the flow manager at [phone] if you are having difficulty reaching the hospitalist taking care of this patient as she can update you and provide the most up-to-date pager number of provider caring for the patient. If 8PM-8AM, please contact night-coverage at [email or web address], password [email or web address]  12/16/2013, 11:32 AM

## 2013-12-16 NOTE — Progress Notes (Signed)
ANTICOAGULATION CONSULT NOTE - Initial Consult  Pharmacy Consult for Lovenox and warfarin Indication: resuming VTE treatment (PE on 11/26)  No Known Allergies  Patient Measurements: Height: 6' 3.5" (191.8 cm) Weight: 167 lb 15.9 oz (76.2 kg) IBW/kg (Calculated) : 85.65  Vital Signs: Temp: 97.1 F (36.2 C) (12/19 0503) Temp src: Oral (12/19 0503) BP: 93/55 mmHg (12/19 0957) Pulse Rate: 66 (12/19 0957)  Labs:  Recent Labs  12/14/13 0354 12/15/13 0445 12/16/13 0535  HGB 8.5* 9.3* 9.3*  HCT 25.6* 29.7* 28.8*  PLT 68* 84* 95*  APTT  --  44*  --   LABPROT 17.9* 15.1 14.9  INR 1.52* 1.22 1.20  CREATININE 0.81 0.80 0.78    Estimated Creatinine Clearance: 91.3 ml/min (by C-G formula based on Cr of 0.78).   Medical History: Past Medical History  Diagnosis Date  . CHF (congestive heart failure)   . Unspecified intracranial hemorrhage   . Pulmonary embolism     history on Coumadin    Medications:  Scheduled:  . digoxin  0.0625 mg Oral Daily  . FLUoxetine  20 mg Oral Daily  . mirtazapine  7.5 mg Oral QHS  . polyethylene glycol  17 g Oral Daily  . potassium chloride  20 mEq Oral BID  . QUEtiapine  50 mg Oral QHS  . sodium chloride  3 mL Intravenous Q12H   Assessment: 72 yo M with recent massive right recurrent PE on 11/26 admitted on 12/16 with epistaxis and supratherapeutic INR (4.1).  He was treated with 4 units of FFP and 5mg  vitamin K. Electrocautery performed by ENT and no further bleeding has been noted x48 hours.  Over the course of reversal and held doses, INR has trended down as expected today to 1.2. Pharmacy is consulted to resume anticoagulation with Lovenox and warfarin.  H/H is low but recovering and stable. Plts are low but trending up.  Nursing home [facility] lists PTA Coumadin dose as 5mg  daily, last dose 12/15. Also of note, upon original discharge on 12/3, it is reported Lovenox was continued beyond bridging completion, unclear as to why.  In setting of  recent bleeding issues and supratherapeutic INR,will dose cautiously for first dose.  Follow up INRs will be more telling of any lasting effects of vit K reversal and patient sensitivity to warfarin that caused the admitting elevated level.  Goal of Therapy:  INR 2-3 Anti-Xa level 0.6-1 units/ml 4hrs after LMWH dose given Monitor platelets by anticoagulation protocol: Yes   Plan:  - give Coumadin PO 5mg  x1 dose tonight - start Lovenox 80mg  q12h for bridging until two therapeutic INRs drawn 24 hours apart, with minimal 5 day overlap, then discontinue  - daily INR and CBC - monitor for s/s of bleeding  Harrold Donath E. Achilles Dunk, PharmD Clinical Pharmacist - Resident Pager: [phone] Pharmacy: [phone] 12/16/2013 12:34 PM

## 2013-12-16 NOTE — Progress Notes (Signed)
Pt O4x lying in Bed. No complaints of SOB or CP. Pt states sacral pain, RN repositioned. Will continue to monitor

## 2013-12-16 NOTE — Progress Notes (Signed)
Pt K3.2 and LAst BM 12-13. Pt 6 beat run of  vt Pt Asym. Md order 40 k and PRN for BM n mirlax. Pt stat 97% on 1l move to RA Pt stat 96 %. Will continue to monitor.

## 2013-12-17 DIAGNOSIS — I959 Hypotension, unspecified: Secondary | ICD-10-CM

## 2013-12-17 LAB — CBC
HCT: 28.5 % — ABNORMAL LOW (ref 39.0–52.0)
Hemoglobin: 9.3 g/dL — ABNORMAL LOW (ref 13.0–17.0)
MCHC: 32.6 g/dL (ref 30.0–36.0)
Platelets: 113 10*3/uL — ABNORMAL LOW (ref 150–400)
RBC: 2.74 MIL/uL — ABNORMAL LOW (ref 4.22–5.81)
WBC: 4.8 10*3/uL (ref 4.0–10.5)

## 2013-12-17 LAB — BASIC METABOLIC PANEL
BUN: 19 mg/dL (ref 6–23)
CO2: 35 mEq/L — ABNORMAL HIGH (ref 19–32)
Calcium: 7.9 mg/dL — ABNORMAL LOW (ref 8.4–10.5)
Chloride: 99 mEq/L (ref 96–112)
GFR calc Af Amer: >90 mL/min (ref >90)
Sodium: 138 mEq/L (ref 135–145)

## 2013-12-17 LAB — PROTIME-INR
INR: 1.33 (ref 0.00–1.49)
Prothrombin Time: 16.2 seconds — ABNORMAL HIGH (ref 11.6–15.2)

## 2013-12-17 NOTE — Progress Notes (Signed)
PT Cancellation Note  Patient Details Name: Chase [name] MRN: [id number] DOB: [birth year]   Cancelled Treatment:    Reason Eval/Treat Not Completed: Other (comment) (pt eating and denied mobility at this time)   Delorse Lek 12/17/2013, 8:09 AM Delaney Meigs, PT [phone]

## 2013-12-17 NOTE — Progress Notes (Signed)
D/C Tele, D/C IV's, D/C instructions reviewed with pt. And D/C paperwork sent with Pt. And EMS who transported pt. From Unit via Stretcher and back to SNF via Ambulance, pt. Stable and denied any complaints of discomfort or distress.

## 2013-12-17 NOTE — Progress Notes (Signed)
PT Cancellation Note  Patient Details Name: NELLO CORRO MRN: [id number] DOB: 09/05/1941   Cancelled Treatment:    Reason Eval/Treat Not Completed: Other (comment) (pt with ordered Discharge today with return to SNF. Will defer eval to SNF   Toney Sang [facility] 12/17/2013, 11:48 AM Delaney Meigs, PT [phone]

## 2013-12-17 NOTE — Discharge Summary (Addendum)
Physician Discharge Summary  Chase [name] ZOX:[id number] DOB: 02/24/1941 DOA: 12/13/2013  PCP: Delorse Lek, MD  Admit date: 12/13/2013 Discharge date: 12/17/2013  Recommendations for Outpatient Follow-up:  1. Daily PT/INR. Discontinue Lovenox when INR 2-3x48 hours.  Routine PT/INR thereafter. 2. Recommend weekly CBC checks.   3. Consider the addition of an ACE inhibitor or an ARB in the future, if his blood pressure is able to tolerate this. Probable blood pressures preclude ordering at this time.  Discharge Diagnoses:  Principal Problem:    Acute blood loss anemia Active Problems:    Pulmonary embolus    CHF (congestive heart failure)    Chronic systolic heart failure    Protein-calorie malnutrition, severe    Thrombocytopenia, unspecified    Hypotension, unspecified    Elevated INR    Loss of weight    Warfarin-induced coagulopathy    Hypokalemia    Hypoxemia  Discharge Condition: Improved.  Diet recommendation: Low-sodium, heart healthy.  History of present illness:  Chase [name] is an 72 y.o. male with a PMH of pulmonary embolism, on chronic Coumadin and Lovenox therapy who was admitted on 12/13/2013 with epistaxis and a supratherapeutic INR.  Hospital Course by problem:  Principal Problem:  Acute blood loss anemia secondary to epistaxis in the setting of a supratherapeutic INR  Secondary to epistaxis in the setting of a supratherapeutic INR which was treated with 4 units of fresh frozen plasma and 5 mg of vitamin K. Had a 4 g drop in his hemoglobin to a low of 8.3 mg/dL before beginning to trend up. Has not required a blood transfusion. Seen by ENT on 12/13/2013 with electrocautery performed achieving hemostasis. Followup ENT in 2 weeks. No further bleeding x 72 hours.  Active Problems:  Pulmonary embolus  Anticoagulation initially placed on hold. Seen by hematologist on 11/24/2013 during prior admission with lifelong anticoagulation recommended.  Remote history of PE with recent massive right PE 11/23/2013. Was apparently on both Lovenox and Coumadin beyond the overlap period. Lovenox/Coumadin resumed 12/16/2013. It is imperative that his INR be monitored closely and that Lovenox be discontinued once his INR is therapeutic x48 hours. Chronic systolic heart failure  Continue digoxin and furosemide. Appears to be well compensated. Last echocardiogram done 11/24/2013 and showed an EF of 15% diffuse hypokinesis. Low blood pressure precludes starting an ACE/ARB at this time. Intake and output balance is negative by 4.5 L over the past 24 hours. Weight is significantly down from admission 81.5 kg---> 74.2 kg.  Protein-calorie malnutrition, severe / loss of weight  Denies any history of having a colonoscopy. Unsure if his PCP has checked his PSA. At this time, he does not want a lot of testing and feels he has lived "a good life ". Of note, the patient was evaluated by Dr. Ladona Ridgel from the palliative care team on 11/27/2013 and appeared to have capacity at that time. Seen by dietitian 12/14/2013 with recommendations noted.  Thrombocytopenia, unspecified  Appears to be somewhat chronic.  Hypotension, unspecified  Resolved with IV fluids.  Elevated INR / Warfarin-induced coagulopathy  Reversed with fresh frozen plasma, 4 units, and vitamin K.  Hypokalemia  Likely from Lasix. Supplementation ordered.  Hypoxemia  Resolved with oxygen saturations currently 98-99%.  Procedures:  None.  Consultations:  Dr. Flo Shanks, ENT  Dr. Coralyn Helling, PCCM  Discharge Exam: Filed Vitals:   12/17/13 0610  BP: 90/61  Pulse: 76  Temp: 97.2 F (36.2 C)  Resp: 18   Filed Vitals:  12/16/13 1619 12/16/13 1621 12/16/13 2014 12/17/13 0610  BP: 90/52 98/56 90/60  90/61  Pulse: 81  84 76  Temp:   97.8 F (36.6 C) 97.2 F (36.2 C)  TempSrc:   Oral Oral  Resp:   18 18  Height:      Weight:    74.2 kg (163 lb 9.3 oz)  SpO2:   95% 96%    Gen:   NAD Cardiovascular:  RRR, No M/R/G Respiratory: Lungs diminished Gastrointestinal: Abdomen soft, NT/ND with normal active bowel sounds. Extremities: No C/E/C   Discharge Instructions  Discharge Orders   Future Orders Complete By Expires   (HEART FAILURE PATIENTS) Call MD:  Anytime you have any of the following symptoms: 1) 3 pound weight gain in 24 hours or 5 pounds in 1 week 2) shortness of breath, with or without a dry hacking cough 3) swelling in the hands, feet or stomach 4) if you have to sleep on extra pillows at night in order to breathe.  As directed    Call MD for:  As directed    Scheduling Instructions:     Any further problems with nosebleeds or bleeding from any other area including blood in the stools or urine, bleeding gums, or excessive bruising.   Diet - low sodium heart healthy  As directed    Discharge instructions  As directed    Comments:     You were cared for by Dr. Hillery Aldo  (a hospitalist) during your hospital stay. If you have any questions about your discharge medications or the care you received while you were in the hospital after you are discharged, you can call the unit and ask to speak with the hospitalist on call if the hospitalist that took care of you is not available. Once you are discharged, your primary care physician will handle any further medical issues. Please note that NO REFILLS for any discharge medications will be authorized once you are discharged, as it is imperative that you return to your primary care physician (or establish a relationship with a primary care physician if you do not have one) for your aftercare needs so that they can reassess your need for medications and monitor your lab values.  Any outstanding tests can be reviewed by your PCP at your follow up visit.  It is also important to review any medicine changes with your PCP.  Please bring these d/c instructions with you to your next visit so your physician can review these changes  with you.  If you do not have a primary care physician, you can call [phone] for a physician referral.  It is highly recommended that you obtain a PCP for hospital follow up.   Increase activity slowly  As directed        Medication List         acetaminophen 650 MG CR tablet  Commonly known as:  TYLENOL  Take 650 mg by mouth every 6 (six) hours as needed for pain.     Digoxin 62.5 MCG Tabs  Take 62.5 mcg by mouth daily.     enoxaparin 80 MG/0.8ML injection  Commonly known as:  LOVENOX  Inject 0.8 mLs (80 mg total) into the skin every 12 (twelve) hours.     feeding supplement (RESOURCE BREEZE) Liqd  Take 1 Container by mouth 3 (three) times daily between meals.     FLUoxetine 20 MG capsule  Commonly known as:  PROZAC  Take 20 mg by mouth daily.  mirtazapine 7.5 MG tablet  Commonly known as:  REMERON  Take 7.5 mg by mouth at bedtime.     polyethylene glycol packet  Commonly known as:  MIRALAX / GLYCOLAX  Take 17 g by mouth daily as needed for mild constipation.     potassium chloride SA 20 MEQ tablet  Commonly known as:  K-DUR,KLOR-CON  Take 1 tablet (20 mEq total) by mouth 2 (two) times daily.     QUEtiapine 50 MG tablet  Commonly known as:  SEROQUEL  Take 50 mg by mouth at bedtime.     warfarin 5 MG tablet  Commonly known as:  COUMADIN  Take 1 tablet (5 mg total) by mouth daily at 6 PM.           Follow-up Information   Follow up with BURNETT,BRENT A, MD. Schedule an appointment as soon as possible for a visit in 1 week.   Specialty:  Family Medicine   Contact information:   [address] [place] [address] [phone]        The results of significant diagnostics from this hospitalization (including imaging, microbiology, ancillary and laboratory) are listed below for reference.    Significant Diagnostic Studies:  Dg Chest Port 1 View  12/15/2013   CLINICAL DATA:  Congestive heart failure.  EXAM: PORTABLE CHEST - 1 VIEW   COMPARISON:  12/14/2013.  FINDINGS: The cardiopericardial silhouette remains enlarged. There are bilateral pleural effusions, greater on the right than left. Right effusion layers laterally at the base. Pulmonary edema is present which is unchanged compared to prior. Monitoring leads project over the chest.  IMPRESSION: Unchanged moderate CHF.   Electronically Signed   By: Andreas Newport M.D.   On: 12/15/2013 07:08   Dg Chest Port 1 View  12/14/2013   CLINICAL DATA:  Epistaxis; consolidation  EXAM: PORTABLE CHEST - 1 VIEW  COMPARISON:  December 13, 2013  FINDINGS: Generalized cardiomegaly is again noted. There is pulmonary venous hypertension. There is interstitial and patchy alveolar edema with bilateral effusions. There is no new appreciable opacity. No pneumothorax. No adenopathy.  IMPRESSION: Evidence of congestive heart failure, stable.  No new opacity.   Electronically Signed   By: Bretta Bang M.D.   On: 12/14/2013 07:39   Dg Chest Port 1 View  12/13/2013   CLINICAL DATA:  Shortness of breath, hemoptysis  EXAM: PORTABLE CHEST - 1 VIEW  COMPARISON:  11/29/2013  FINDINGS: Cardiomegaly. Central mild vascular congestion and mild perihilar interstitial prominence suspicious for mild interstitial edema. There is right small pleural effusion with right lower lobe atelectasis or infiltrate. Mild left basilar atelectasis or infiltrate.  IMPRESSION: Central mild vascular congestion and mild perihilar interstitial prominence suspicious for mild interstitial edema. There is right small pleural effusion with right lower lobe atelectasis or infiltrate. Mild left basilar atelectasis or infiltrate.   Electronically Signed   By: Natasha Mead M.D.   On: 12/13/2013 10:04   Labs:  Basic Metabolic Panel:  Recent Labs Lab 12/13/13 0926 12/14/13 0354 12/15/13 0445 12/16/13 0535 12/17/13 0430  NA 138 141 141 138 138  K 4.2 4.4 3.8 3.2* 3.7  CL 104 107 105 100 99  CO2 28 27 30  32 35*  GLUCOSE 120* 72 74  83 81  BUN 31* 39* 30* 25* 19  CREATININE 0.97 0.81 0.80 0.78 0.73  CALCIUM 7.8* 7.8* 8.1* 8.0* 7.9*  MG  --   --  1.6  --   --  PHOS  --   --  2.7  --   --    GFR Estimated Creatinine Clearance: 88.9 ml/min (by C-G formula based on Cr of 0.73).  Coagulation profile  Recent Labs Lab 12/13/13 1815 12/14/13 0354 12/15/13 0445 12/16/13 0535 12/17/13 0430  INR 1.82* 1.52* 1.22 1.20 1.33    CBC:  Recent Labs Lab 12/13/13 0926 12/13/13 1815 12/14/13 0354 12/15/13 0445 12/16/13 0535 12/17/13 0430  WBC 6.4 6.5 4.2 4.6 5.2 4.8  NEUTROABS 4.6  --   --  3.2  --   --   HGB 12.6* 9.6* 8.5* 9.3* 9.3* 9.3*  HCT 39.1 29.9* 25.6* 29.7* 28.8* 28.5*  MCV 107.7* 106.0* 105.8* 112.1* 106.3* 104.0*  PLT 109* 88* 68* 84* 95* 113*   Cardiac Enzymes:  Recent Labs Lab 12/13/13 0926  TROPONINI <0.30   CBG:  Recent Labs Lab 12/13/13 1545 12/13/13 1906 12/13/13 2342 12/14/13 0403 12/14/13 0759  GLUCAP 101* 88 77 78 80   Microbiology Recent Results (from the past 240 hour(s))  MRSA PCR SCREENING     Status: None   Collection Time    12/13/13  2:33 PM      Result Value Range Status   MRSA by PCR NEGATIVE  NEGATIVE Final   Comment:            The GeneXpert MRSA Assay (FDA     approved for NASAL specimens     only), is one component of a     comprehensive MRSA colonization     surveillance program. It is not     intended to diagnose MRSA     infection nor to guide or     monitor treatment for     MRSA infections.    Time coordinating discharge: 35 minutes.  Signed:  RAMA,CHRISTINA  Pager [phone] [facility] 12/17/2013, 9:45 AM

## 2013-12-17 NOTE — Progress Notes (Signed)
CSW (Clinical Child psychotherapist) prepared pt dc packet and placed with shadow chart. CSW arranged non-emergent ambulance transport. Pt, pt nurse, and facility informed. CSW sent dc summary to facility and confirmed pt was okay to return with Levora Dredge, Administrator at facility. CSW signing off.  Korianna Washer, LCSWA [phone]

## 2014-01-01 ENCOUNTER — Inpatient Hospital Stay (HOSPITAL_COMMUNITY)
Admission: EM | Admit: 2014-01-01 | Discharge: 2014-01-29 | DRG: 177 | Disposition: E | Payer: Medicare Other | Attending: Internal Medicine | Admitting: Internal Medicine

## 2014-01-01 DIAGNOSIS — I509 Heart failure, unspecified: Secondary | ICD-10-CM | POA: Diagnosis present

## 2014-01-01 DIAGNOSIS — J690 Pneumonitis due to inhalation of food and vomit: Principal | ICD-10-CM | POA: Diagnosis present

## 2014-01-01 DIAGNOSIS — I2699 Other pulmonary embolism without acute cor pulmonale: Secondary | ICD-10-CM

## 2014-01-01 DIAGNOSIS — Z66 Do not resuscitate: Secondary | ICD-10-CM | POA: Diagnosis present

## 2014-01-01 DIAGNOSIS — R109 Unspecified abdominal pain: Secondary | ICD-10-CM | POA: Diagnosis present

## 2014-01-01 DIAGNOSIS — I428 Other cardiomyopathies: Secondary | ICD-10-CM | POA: Diagnosis present

## 2014-01-01 DIAGNOSIS — T45515A Adverse effect of anticoagulants, initial encounter: Secondary | ICD-10-CM | POA: Diagnosis present

## 2014-01-01 DIAGNOSIS — E872 Acidosis, unspecified: Secondary | ICD-10-CM

## 2014-01-01 DIAGNOSIS — J9600 Acute respiratory failure, unspecified whether with hypoxia or hypercapnia: Secondary | ICD-10-CM | POA: Diagnosis present

## 2014-01-01 DIAGNOSIS — E162 Hypoglycemia, unspecified: Secondary | ICD-10-CM | POA: Diagnosis present

## 2014-01-01 DIAGNOSIS — I5022 Chronic systolic (congestive) heart failure: Secondary | ICD-10-CM

## 2014-01-01 DIAGNOSIS — R5381 Other malaise: Secondary | ICD-10-CM | POA: Diagnosis present

## 2014-01-01 DIAGNOSIS — I2789 Other specified pulmonary heart diseases: Secondary | ICD-10-CM | POA: Diagnosis present

## 2014-01-01 DIAGNOSIS — E46 Unspecified protein-calorie malnutrition: Secondary | ICD-10-CM | POA: Diagnosis present

## 2014-01-01 DIAGNOSIS — R627 Adult failure to thrive: Secondary | ICD-10-CM | POA: Diagnosis present

## 2014-01-01 DIAGNOSIS — Z8673 Personal history of transient ischemic attack (TIA), and cerebral infarction without residual deficits: Secondary | ICD-10-CM

## 2014-01-01 DIAGNOSIS — R0902 Hypoxemia: Secondary | ICD-10-CM

## 2014-01-01 DIAGNOSIS — E875 Hyperkalemia: Secondary | ICD-10-CM | POA: Diagnosis present

## 2014-01-01 DIAGNOSIS — R64 Cachexia: Secondary | ICD-10-CM | POA: Diagnosis present

## 2014-01-01 DIAGNOSIS — R5383 Other fatigue: Secondary | ICD-10-CM

## 2014-01-01 DIAGNOSIS — Z7901 Long term (current) use of anticoagulants: Secondary | ICD-10-CM

## 2014-01-01 DIAGNOSIS — I2782 Chronic pulmonary embolism: Secondary | ICD-10-CM | POA: Diagnosis present

## 2014-01-01 DIAGNOSIS — N179 Acute kidney failure, unspecified: Secondary | ICD-10-CM | POA: Diagnosis present

## 2014-01-01 DIAGNOSIS — D6832 Hemorrhagic disorder due to extrinsic circulating anticoagulants: Secondary | ICD-10-CM

## 2014-01-01 DIAGNOSIS — D62 Acute posthemorrhagic anemia: Secondary | ICD-10-CM

## 2014-01-01 DIAGNOSIS — D689 Coagulation defect, unspecified: Secondary | ICD-10-CM | POA: Diagnosis present

## 2014-01-01 DIAGNOSIS — R1310 Dysphagia, unspecified: Secondary | ICD-10-CM | POA: Diagnosis present

## 2014-01-01 DIAGNOSIS — R040 Epistaxis: Secondary | ICD-10-CM

## 2014-01-01 DIAGNOSIS — J189 Pneumonia, unspecified organism: Secondary | ICD-10-CM | POA: Diagnosis present

## 2014-01-01 DIAGNOSIS — E871 Hypo-osmolality and hyponatremia: Secondary | ICD-10-CM | POA: Diagnosis present

## 2014-01-01 DIAGNOSIS — R791 Abnormal coagulation profile: Secondary | ICD-10-CM

## 2014-01-01 DIAGNOSIS — D539 Nutritional anemia, unspecified: Secondary | ICD-10-CM | POA: Diagnosis present

## 2014-01-01 DIAGNOSIS — Z820 Family history of epilepsy and other diseases of the nervous system: Secondary | ICD-10-CM

## 2014-01-01 DIAGNOSIS — R609 Edema, unspecified: Secondary | ICD-10-CM | POA: Diagnosis present

## 2014-01-01 DIAGNOSIS — R634 Abnormal weight loss: Secondary | ICD-10-CM | POA: Diagnosis present

## 2014-01-01 DIAGNOSIS — Z515 Encounter for palliative care: Secondary | ICD-10-CM

## 2014-01-01 DIAGNOSIS — IMO0002 Reserved for concepts with insufficient information to code with codable children: Secondary | ICD-10-CM

## 2014-01-01 DIAGNOSIS — Z823 Family history of stroke: Secondary | ICD-10-CM

## 2014-01-01 DIAGNOSIS — E43 Unspecified severe protein-calorie malnutrition: Secondary | ICD-10-CM | POA: Diagnosis present

## 2014-01-01 DIAGNOSIS — I959 Hypotension, unspecified: Secondary | ICD-10-CM | POA: Diagnosis present

## 2014-01-01 NOTE — ED Notes (Signed)
Bed: RESA Expected date:  Expected time:  Means of arrival:  Comments: EMS 73yo M, Hypotension, jaundice, pale

## 2014-01-01 NOTE — ED Notes (Signed)
Per EMS pt has a hx of GI bleeds.  Staff at [facility] told EMS pt has been having more frequent BM's.  Pt has no c/o at this time.

## 2014-01-02 ENCOUNTER — Encounter (HOSPITAL_COMMUNITY): Payer: Self-pay | Admitting: Emergency Medicine

## 2014-01-02 ENCOUNTER — Emergency Department (HOSPITAL_COMMUNITY): Payer: Medicare Other

## 2014-01-02 DIAGNOSIS — E872 Acidosis, unspecified: Secondary | ICD-10-CM

## 2014-01-02 DIAGNOSIS — E875 Hyperkalemia: Secondary | ICD-10-CM

## 2014-01-02 DIAGNOSIS — I2699 Other pulmonary embolism without acute cor pulmonale: Secondary | ICD-10-CM

## 2014-01-02 DIAGNOSIS — T45511A Poisoning by anticoagulants, accidental (unintentional), initial encounter: Secondary | ICD-10-CM

## 2014-01-02 DIAGNOSIS — R040 Epistaxis: Secondary | ICD-10-CM

## 2014-01-02 DIAGNOSIS — T458X1A Poisoning by other primarily systemic and hematological agents, accidental (unintentional), initial encounter: Secondary | ICD-10-CM

## 2014-01-02 DIAGNOSIS — R791 Abnormal coagulation profile: Secondary | ICD-10-CM

## 2014-01-02 LAB — BASIC METABOLIC PANEL
BUN: 57 mg/dL — ABNORMAL HIGH (ref 6–23)
CALCIUM: 8.4 mg/dL (ref 8.4–10.5)
CO2: 22 meq/L (ref 19–32)
CREATININE: 1 mg/dL (ref 0.50–1.35)
Chloride: 103 mEq/L (ref 96–112)
GFR calc Af Amer: 85 mL/min — ABNORMAL LOW (ref >90)
GFR, EST NON AFRICAN AMERICAN: 74 mL/min — AB (ref >90)
Glucose, Bld: 86 mg/dL (ref 70–99)
Potassium: 5.2 mEq/L (ref 3.7–5.3)
SODIUM: 137 meq/L (ref 137–147)

## 2014-01-02 LAB — PROTIME-INR
INR: 9.04 (ref 0.00–1.49)
INR: 6.89 (ref 0.00–1.49)
PROTHROMBIN TIME: 69.8 s — AB (ref 11.6–15.2)
Prothrombin Time: 77.8 seconds — ABNORMAL HIGH (ref 11.6–15.2)
Prothrombin Time: 56.7 seconds — ABNORMAL HIGH (ref 11.6–15.2)

## 2014-01-02 LAB — BLOOD GAS, ARTERIAL
Acid-base deficit: 3.5 mmol/L — ABNORMAL HIGH (ref 0.0–2.0)
Acid-base deficit: 8.6 mmol/L — ABNORMAL HIGH (ref 0.0–2.0)
Bicarbonate: 16 mEq/L — ABNORMAL LOW (ref 20.0–24.0)
Bicarbonate: 21 mEq/L (ref 20.0–24.0)
DRAWN BY: 275531
DRAWN BY: 308601
O2 CONTENT: 4 L/min
O2 CONTENT: 5 L/min
O2 SAT: 97.3 %
O2 SAT: 99.1 %
PCO2 ART: 31.2 mmHg — AB (ref 35.0–45.0)
PO2 ART: 98.4 mmHg (ref 80.0–100.0)
Patient temperature: 37
Patient temperature: 98.6
TCO2: 15.3 mmol/L (ref 0–100)
TCO2: 20.1 mmol/L (ref 0–100)
pCO2 arterial: 38.4 mmHg (ref 35.0–45.0)
pH, Arterial: 7.33 — ABNORMAL LOW (ref 7.350–7.450)
pH, Arterial: 7.357 (ref 7.350–7.450)
pO2, Arterial: 148 mmHg — ABNORMAL HIGH (ref 80.0–100.0)

## 2014-01-02 LAB — CBC WITH DIFFERENTIAL/PLATELET
BASOS ABS: 0 10*3/uL (ref 0.0–0.1)
Basophils Relative: 0 % (ref 0–1)
Eosinophils Absolute: 0 10*3/uL (ref 0.0–0.7)
Eosinophils Relative: 0 % (ref 0–5)
HEMATOCRIT: 27 % — AB (ref 39.0–52.0)
Hemoglobin: 8.9 g/dL — ABNORMAL LOW (ref 13.0–17.0)
LYMPHS ABS: 0.9 10*3/uL (ref 0.7–4.0)
Lymphocytes Relative: 18 % (ref 12–46)
MCH: 34.6 pg — AB (ref 26.0–34.0)
MCHC: 33 g/dL (ref 30.0–36.0)
MCV: 105.1 fL — AB (ref 78.0–100.0)
MONO ABS: 0.4 10*3/uL (ref 0.1–1.0)
MONOS PCT: 8 % (ref 3–12)
NEUTROS ABS: 3.8 10*3/uL (ref 1.7–7.7)
Neutrophils Relative %: 74 % (ref 43–77)
PLATELETS: 241 10*3/uL (ref 150–400)
RBC: 2.57 MIL/uL — ABNORMAL LOW (ref 4.22–5.81)
RDW: 20 % — ABNORMAL HIGH (ref 11.5–15.5)
WBC: 5.1 10*3/uL (ref 4.0–10.5)

## 2014-01-02 LAB — POCT I-STAT, CHEM 8
BUN: 88 mg/dL — AB (ref 6–23)
BUN: 62 mg/dL — ABNORMAL HIGH (ref 6–23)
CHLORIDE: 104 meq/L (ref 96–112)
CREATININE: 1.2 mg/dL (ref 0.50–1.35)
Calcium, Ion: 1.09 mmol/L — ABNORMAL LOW (ref 1.13–1.30)
Calcium, Ion: 1.16 mmol/L (ref 1.13–1.30)
Chloride: 103 mEq/L (ref 96–112)
Creatinine, Ser: 1.3 mg/dL (ref 0.50–1.35)
GLUCOSE: 37 mg/dL — AB (ref 70–99)
Glucose, Bld: 111 mg/dL — ABNORMAL HIGH (ref 70–99)
HCT: 31 % — ABNORMAL LOW (ref 39.0–52.0)
HCT: 29 % — ABNORMAL LOW (ref 39.0–52.0)
HEMOGLOBIN: 10.5 g/dL — AB (ref 13.0–17.0)
Hemoglobin: 9.9 g/dL — ABNORMAL LOW (ref 13.0–17.0)
POTASSIUM: 6 meq/L — AB (ref 3.7–5.3)
Potassium: 7.6 mEq/L (ref 3.7–5.3)
SODIUM: 131 meq/L — AB (ref 137–147)
Sodium: 136 mEq/L — ABNORMAL LOW (ref 137–147)
TCO2: 20 mmol/L (ref 0–100)
TCO2: 21 mmol/L (ref 0–100)

## 2014-01-02 LAB — URINALYSIS, ROUTINE W REFLEX MICROSCOPIC
Glucose, UA: NEGATIVE mg/dL
Hgb urine dipstick: NEGATIVE
KETONES UR: NEGATIVE mg/dL
NITRITE: NEGATIVE
PROTEIN: NEGATIVE mg/dL
Specific Gravity, Urine: 1.021 (ref 1.005–1.030)
UROBILINOGEN UA: 1 mg/dL (ref 0.0–1.0)
pH: 5 (ref 5.0–8.0)

## 2014-01-02 LAB — COMPREHENSIVE METABOLIC PANEL
ALT: 18 U/L (ref 0–53)
AST: 28 U/L (ref 0–37)
Albumin: 2.6 g/dL — ABNORMAL LOW (ref 3.5–5.2)
Alkaline Phosphatase: 142 U/L — ABNORMAL HIGH (ref 39–117)
BILIRUBIN TOTAL: 2.5 mg/dL — AB (ref 0.3–1.2)
BUN: 63 mg/dL — AB (ref 6–23)
CO2: 18 mEq/L — ABNORMAL LOW (ref 19–32)
CREATININE: 1.26 mg/dL (ref 0.50–1.35)
Calcium: 8.6 mg/dL (ref 8.4–10.5)
Chloride: 99 mEq/L (ref 96–112)
GFR calc non Af Amer: 56 mL/min — ABNORMAL LOW (ref >90)
GFR, EST AFRICAN AMERICAN: 65 mL/min — AB (ref >90)
GLUCOSE: 114 mg/dL — AB (ref 70–99)
POTASSIUM: 7.6 meq/L — AB (ref 3.7–5.3)
Sodium: 133 mEq/L — ABNORMAL LOW (ref 137–147)
Total Protein: 5.6 g/dL — ABNORMAL LOW (ref 6.0–8.3)

## 2014-01-02 LAB — CG4 I-STAT (LACTIC ACID): LACTIC ACID, VENOUS: 5.64 mmol/L — AB (ref 0.5–2.2)

## 2014-01-02 LAB — URINE MICROSCOPIC-ADD ON

## 2014-01-02 LAB — PHOSPHORUS: PHOSPHORUS: 4.5 mg/dL (ref 2.3–4.6)

## 2014-01-02 LAB — CORTISOL: CORTISOL PLASMA: 17.6 ug/dL

## 2014-01-02 LAB — DIGOXIN LEVEL: Digoxin Level: 1.1 ng/mL (ref 0.8–2.0)

## 2014-01-02 LAB — POTASSIUM
Potassium: 7.2 mEq/L (ref 3.7–5.3)
Potassium: 7 mEq/L (ref 3.7–5.3)

## 2014-01-02 LAB — GLUCOSE, CAPILLARY: Glucose-Capillary: 90 mg/dL (ref 70–99)

## 2014-01-02 LAB — PROCALCITONIN: PROCALCITONIN: 1.68 ng/mL

## 2014-01-02 LAB — APTT: aPTT: 66 seconds — ABNORMAL HIGH (ref 24–37)

## 2014-01-02 LAB — MAGNESIUM: Magnesium: 2.3 mg/dL (ref 1.5–2.5)

## 2014-01-02 LAB — MRSA PCR SCREENING: MRSA BY PCR: POSITIVE — AB

## 2014-01-02 MED ORDER — DEXTROSE 5 % IV SOLN
1.0000 g | Freq: Three times a day (TID) | INTRAVENOUS | Status: DC
  Administered 2014-01-02 – 2014-01-06 (×11): 1 g via INTRAVENOUS
  Filled 2014-01-02 (×16): qty 1

## 2014-01-02 MED ORDER — POTASSIUM CHLORIDE 10 MEQ/100ML IV SOLN: 10.0000 meq | Freq: Once | INTRAVENOUS | Status: DC

## 2014-01-02 MED ORDER — PHYTONADIONE 5 MG PO TABS
5.0000 mg | ORAL_TABLET | Freq: Once | ORAL | Status: AC
  Administered 2014-01-02: 5 mg via ORAL
  Filled 2014-01-02: qty 1

## 2014-01-02 MED ORDER — CHLORHEXIDINE GLUCONATE CLOTH 2 % EX PADS
6.0000 | MEDICATED_PAD | Freq: Every day | CUTANEOUS | Status: AC
  Administered 2014-01-03 – 2014-01-07 (×5): 6 via TOPICAL

## 2014-01-02 MED ORDER — DEXTROSE 50 % IV SOLN
INTRAVENOUS | Status: AC
  Filled 2014-01-02: qty 50

## 2014-01-02 MED ORDER — DEXTROSE 50 % IV SOLN
50.0000 mL | Freq: Once | INTRAVENOUS | Status: AC
  Administered 2014-01-02: 50 mL via INTRAVENOUS

## 2014-01-02 MED ORDER — MUPIROCIN 2 % EX OINT
1.0000 "application " | TOPICAL_OINTMENT | Freq: Two times a day (BID) | CUTANEOUS | Status: AC
  Administered 2014-01-02 – 2014-01-07 (×10): 1 via NASAL
  Filled 2014-01-02 (×3): qty 22

## 2014-01-02 MED ORDER — SODIUM POLYSTYRENE SULFONATE 15 GM/60ML PO SUSP
30.0000 g | Freq: Once | ORAL | Status: AC
  Administered 2014-01-02: 30 g via ORAL
  Filled 2014-01-02: qty 120

## 2014-01-02 MED ORDER — DEXTROSE 50 % IV SOLN
1.0000 | Freq: Once | INTRAVENOUS | Status: AC
  Administered 2014-01-02: 50 mL via INTRAVENOUS
  Filled 2014-01-02: qty 50

## 2014-01-02 MED ORDER — INSULIN ASPART 100 UNIT/ML IV SOLN
10.0000 [IU] | Freq: Once | INTRAVENOUS | Status: AC
  Administered 2014-01-02: 10 [IU] via INTRAVENOUS
  Filled 2014-01-02: qty 0.1

## 2014-01-02 MED ORDER — SODIUM CHLORIDE 0.9 % IV BOLUS (SEPSIS)
250.0000 mL | Freq: Once | INTRAVENOUS | Status: AC
  Administered 2014-01-02: 250 mL via INTRAVENOUS

## 2014-01-02 MED ORDER — INSULIN ASPART 100 UNIT/ML ~~LOC~~ SOLN
10.0000 [IU] | Freq: Once | SUBCUTANEOUS | Status: DC
  Filled 2014-01-02: qty 0.1

## 2014-01-02 MED ORDER — CHLORHEXIDINE GLUCONATE 0.12 % MT SOLN
OROMUCOSAL | Status: AC
  Administered 2014-01-02: 15 mL
  Filled 2014-01-02: qty 15

## 2014-01-02 MED ORDER — SODIUM BICARBONATE 8.4 % IV SOLN
50.0000 meq | Freq: Once | INTRAVENOUS | Status: AC
  Administered 2014-01-02: 50 meq via INTRAVENOUS
  Filled 2014-01-02: qty 50

## 2014-01-02 MED ORDER — SODIUM CHLORIDE 0.9 % IV SOLN
INTRAVENOUS | Status: DC
  Administered 2014-01-02: 50 mL/h via INTRAVENOUS
  Administered 2014-01-05: 23:00:00 via INTRAVENOUS

## 2014-01-02 MED ORDER — SODIUM CHLORIDE 0.9 % IV SOLN
1.0000 g | Freq: Once | INTRAVENOUS | Status: AC
  Administered 2014-01-02: 1 g via INTRAVENOUS
  Filled 2014-01-02: qty 10

## 2014-01-02 MED ORDER — VANCOMYCIN HCL IN DEXTROSE 750-5 MG/150ML-% IV SOLN
750.0000 mg | Freq: Two times a day (BID) | INTRAVENOUS | Status: DC
  Administered 2014-01-02 – 2014-01-03 (×2): 750 mg via INTRAVENOUS
  Filled 2014-01-02 (×5): qty 150

## 2014-01-02 MED ORDER — VANCOMYCIN HCL IN DEXTROSE 1-5 GM/200ML-% IV SOLN
1000.0000 mg | Freq: Once | INTRAVENOUS | Status: AC
  Administered 2014-01-02: 1000 mg via INTRAVENOUS
  Filled 2014-01-02: qty 200

## 2014-01-02 MED ORDER — PANTOPRAZOLE SODIUM 40 MG IV SOLR
40.0000 mg | Freq: Every day | INTRAVENOUS | Status: DC
  Administered 2014-01-02 – 2014-01-06 (×5): 40 mg via INTRAVENOUS
  Filled 2014-01-02 (×9): qty 40

## 2014-01-02 MED ORDER — PRAMOXINE-ZINC OXIDE IN MO 1-12.5 % RE OINT
TOPICAL_OINTMENT | Freq: Three times a day (TID) | RECTAL | Status: DC | PRN
  Filled 2014-01-02: qty 28.3

## 2014-01-02 MED ORDER — DEXTROSE 5 % IV SOLN
2.0000 g | Freq: Once | INTRAVENOUS | Status: AC
  Administered 2014-01-02: 2 g via INTRAVENOUS
  Filled 2014-01-02: qty 2

## 2014-01-02 NOTE — Progress Notes (Signed)
Notified Dr. Delton Coombes and Brett Canales Minor, NP of BP and no urine output.

## 2014-01-02 NOTE — ED Notes (Signed)
Pt sounds like he may be aspirating when drinking water.

## 2014-01-02 NOTE — ED Notes (Signed)
[facility] EDP given CG4 Lactic results.
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
## 2014-01-02 NOTE — ED Notes (Signed)
O2 sats dropping unclear if this is a true value, non rebreather placed on pt w/ improvement in o2 sats.

## 2014-01-02 NOTE — Progress Notes (Signed)
[id number]/Julianna Vanwagner, RN, BSN, CCM [phone] Chart Reviewed for discharge and hospital needs. Discharge needs at time of review:  None present will follow for needs. Review of patient progress due on [id number]. 

## 2014-01-02 NOTE — Evaluation (Signed)
SLP Cancellation Note  Patient Details Name: Chase [name] MRN: [id number] DOB: January 29, 1941   Cancelled treatment:       Reason Eval/Treat Not Completed: Medical issues which prohibited therapy;Fatigue/lethargy limiting ability to participate  Pt with open mouth posture, eyes closed.  Awakens briefly with verbal/tactile stimulation has weak phonation and cough ability.  Pt C/O xerostomia for which nurse provided moist toothette for comfort.    SLP to return for evaluation when pt fully alert and able to participate due to lethargy contributing to aspiration risk- RN Selena Batten agreed.     Donavan Burnet, MS [facility] SLP [phone]

## 2014-01-02 NOTE — ED Notes (Signed)
[facility] ED[facility] given Chem 8 results.

## 2014-01-02 NOTE — ED Notes (Addendum)
Pt's sister Clois Dupes lives in [place] and is pt's HCPOA.  Pt verbalized that it is okay to give [place] private health information at any time.  They have created a password "Stinger" to ensure we are speaking to correct person when Clois Dupes calls to obtain information on her brother. Also Clois Dupes would like to be contacted w/ any updates she can be reached at [phone] between 07:15 and 1715 any time after that she can be reached on her cell at [phone].  Clois Dupes would also like to speak w/ someone from care management once the pt reaches the floor to discuss issues r/t pt's financial POA.
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
## 2014-01-02 NOTE — Progress Notes (Signed)
ANTIBIOTIC CONSULT NOTE - INITIAL  Pharmacy Consult for Vancomycin & Cefepime Indication: Sepsis  No Known Allergies  Patient Measurements: Height: 6\' 4"  (193 cm) Weight: 163 lb 9.3 oz (74.2 kg) IBW/kg (Calculated) : 86.8  Vital Signs: Temp: 97.5 F (36.4 C) (01/05 0037) Temp src: Rectal (01/05 0037) BP: 82/55 mmHg (01/05 0036) Pulse Rate: 38 (01/05 0036) Intake/Output from previous day:   Intake/Output from this shift:    Labs:  Recent Labs  01/02/14 0035 01/02/14 0102  WBC 5.1  --   HGB 8.9* 10.5*  PLT 241  --   CREATININE 1.26 1.30   Estimated Creatinine Clearance: 54.7 ml/min (by C-G formula based on Cr of 1.3). No results found for this basename: VANCOTROUGH, Leodis Binet, VANCORANDOM, GENTTROUGH, GENTPEAK, GENTRANDOM, TOBRATROUGH, TOBRAPEAK, TOBRARND, AMIKACINPEAK, AMIKACINTROU, AMIKACIN,  in the last 72 hours   Microbiology: Recent Results (from the past 720 hour(s))  MRSA PCR SCREENING     Status: None   Collection Time    12/13/13  2:33 PM      Result Value Range Status   MRSA by PCR NEGATIVE  NEGATIVE Final   Comment:            The GeneXpert MRSA Assay (FDA     approved for NASAL specimens     only), is one component of a     comprehensive MRSA colonization     surveillance program. It is not     intended to diagnose MRSA     infection nor to guide or     monitor treatment for     MRSA infections.    Medical History: Past Medical History  Diagnosis Date  . CHF (congestive heart failure)   . Unspecified intracranial hemorrhage   . Pulmonary embolism     history on Coumadin  . H/O blood clots 12/16/13    lungs  . Stroke     Medications:  Scheduled:  . insulin aspart  10 Units Intravenous Once  . phytonadione  5 mg Oral Once   Infusions:  . ceFEPime (MAXIPIME) IV    . sodium chloride    . vancomycin     Assessment:  73 yr male with hypotension, jaundiced and pale  No current H&P at this time  Urine and blood cultures  ordered  Initial Vancomycin 1gm and Cefepime 2gm iv x 1 ordered in ED  Upon admission, pharmacy asked to dose Vancomycin & Cefepime for sepsis  Goal of Therapy:  Vancomycin trough level 15-20 mcg/ml  Plan:  Measure antibiotic drug levels at steady state Follow up culture results  Vancomycin 750mg  IV q12h  Cefepime 1gm IV q8h  Colleena Kurtenbach, Joselyn Glassman, PharmD 01/02/2014,2:13 AM

## 2014-01-02 NOTE — Progress Notes (Signed)
CRITICAL VALUE ALERT  Critical value received:  Positive MRSA pcr  Date of notification:  01-02-2014  Time of notification:   1659  Critical value read back:  yes  Nurse who received alert:  Roney Jaffe, RN  MD notified (1st page):  Notified Dr. Sung Amabile, nurse, Pola Corn  Time of first page:  1651  MD notified (2nd page):  Time of second page:  Responding MD:    Time MD responded:

## 2014-01-03 ENCOUNTER — Inpatient Hospital Stay (HOSPITAL_COMMUNITY): Payer: Medicare Other

## 2014-01-03 ENCOUNTER — Encounter (HOSPITAL_COMMUNITY): Payer: Self-pay | Admitting: Radiology

## 2014-01-03 DIAGNOSIS — T45515A Adverse effect of anticoagulants, initial encounter: Secondary | ICD-10-CM

## 2014-01-03 LAB — URINE CULTURE
Colony Count: NO GROWTH
Culture: NO GROWTH

## 2014-01-03 LAB — CBC
HCT: 30.1 % — ABNORMAL LOW (ref 39.0–52.0)
Hemoglobin: 9.7 g/dL — ABNORMAL LOW (ref 13.0–17.0)
MCH: 34.4 pg — AB (ref 26.0–34.0)
MCHC: 32.2 g/dL (ref 30.0–36.0)
MCV: 106.7 fL — AB (ref 78.0–100.0)
PLATELETS: 227 10*3/uL (ref 150–400)
RBC: 2.82 MIL/uL — AB (ref 4.22–5.81)
RDW: 20.1 % — ABNORMAL HIGH (ref 11.5–15.5)
WBC: 6 10*3/uL (ref 4.0–10.5)

## 2014-01-03 LAB — STREP PNEUMONIAE URINARY ANTIGEN: Strep Pneumo Urinary Antigen: NEGATIVE

## 2014-01-03 LAB — BASIC METABOLIC PANEL
BUN: 55 mg/dL — ABNORMAL HIGH (ref 6–23)
CALCIUM: 8.4 mg/dL (ref 8.4–10.5)
CO2: 20 meq/L (ref 19–32)
CREATININE: 0.97 mg/dL (ref 0.50–1.35)
Chloride: 102 mEq/L (ref 96–112)
GFR calc Af Amer: >90 mL/min (ref >90)
GFR calc non Af Amer: 81 mL/min — ABNORMAL LOW (ref >90)
Glucose, Bld: 81 mg/dL (ref 70–99)
Potassium: 5 mEq/L (ref 3.7–5.3)
Sodium: 136 mEq/L — ABNORMAL LOW (ref 137–147)

## 2014-01-03 LAB — LEGIONELLA ANTIGEN, URINE: Legionella Antigen, Urine: NEGATIVE

## 2014-01-03 LAB — DIGOXIN LEVEL: DIGOXIN LVL: 0.8 ng/mL (ref 0.8–2.0)

## 2014-01-03 MED ORDER — VANCOMYCIN HCL IN DEXTROSE 1-5 GM/200ML-% IV SOLN
1000.0000 mg | Freq: Two times a day (BID) | INTRAVENOUS | Status: DC
  Administered 2014-01-03 – 2014-01-05 (×4): 1000 mg via INTRAVENOUS
  Filled 2014-01-03 (×6): qty 200

## 2014-01-03 MED ORDER — BIOTENE DRY MOUTH MT LIQD
15.0000 mL | Freq: Two times a day (BID) | OROMUCOSAL | Status: DC
  Administered 2014-01-03 – 2014-01-06 (×7): 15 mL via OROMUCOSAL

## 2014-01-03 MED ORDER — BOOST / RESOURCE BREEZE PO LIQD
1.0000 | Freq: Three times a day (TID) | ORAL | Status: DC
  Administered 2014-01-03 – 2014-01-05 (×4): 1 via ORAL

## 2014-01-03 MED ORDER — IOHEXOL 300 MG/ML  SOLN
100.0000 mL | Freq: Once | INTRAMUSCULAR | Status: AC | PRN
  Administered 2014-01-03: 100 mL via INTRAVENOUS

## 2014-01-03 MED ORDER — CHLORHEXIDINE GLUCONATE 0.12 % MT SOLN
15.0000 mL | Freq: Two times a day (BID) | OROMUCOSAL | Status: DC
Start: 2014-01-03 — End: 2014-01-08
  Administered 2014-01-03 – 2014-01-06 (×8): 15 mL via OROMUCOSAL
  Filled 2014-01-03 (×13): qty 15

## 2014-01-03 MED ORDER — IOHEXOL 300 MG/ML  SOLN
25.0000 mL | INTRAMUSCULAR | Status: AC
  Administered 2014-01-03: 25 mL via ORAL

## 2014-01-03 NOTE — Progress Notes (Signed)
ANTIBIOTIC CONSULT NOTE - FOLLOW UP  Pharmacy Consult for Vancomycin, Cefepime Indication: HCAP  No Known Allergies  Labs:  Recent Labs  01/02/14 0035 01/02/14 0102 01/02/14 0829 01/02/14 2215 01/03/14 0415  WBC 5.1  --   --   --  6.0  HGB 8.9* 10.5* 9.9*  --  9.7*  PLT 241  --   --   --  227  CREATININE 1.26 1.30 1.20 1.00 0.97    Assessment: 72 yoM discharged from [facility] 12/20 after treatment for ABLA 2/2 epistaxis in the setting of supratherapeutic INR. Pt returned 1/4 with c/o hypotension. In the ED, found with BP 82/58, hyperkalemia, INR < 10, lactate > 5, CXR cannot rule out PNA. Treating for HCAP with Vanc/Cefepime per Rx dosing. Appears to have aspirate water in the ER per MD notes.   1/5 >> Vanc >> 1/5 >> Cefepime >>   Tmax: afeb WBCs: wnl Renal: Scr 0.97, CG 75, N 71 PCT >> 1.68 (1/5)  1/5 strep pneumo ag >> neg 1/5 legionella ag >> pending 1/5 mrsa pcr >> positive 1/5 urine >> NGF 1/5 blood x 2 >> NGTD  Today is D#2 of Vanc, Cefepime. Renal function improved.    Plan:   Change Vancomycin to 1g IV q12h  Continue Cefepime as ordered  F/u narrowing of antibiotics  Geoffry Paradise, PharmD, BCPS Pager: [phone] 10:11 AM Pharmacy #: [id number]
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
## 2014-01-03 NOTE — Progress Notes (Addendum)
Clinical Social Work Department BRIEF PSYCHOSOCIAL ASSESSMENT 01/03/2014  Patient:  MATEO, SHAPIRO     Account Number:  [id number]     Admit date:  01/26/2014  Clinical Social Worker:  Jacelyn Grip  Date/Time:  01/03/2014 04:10 PM  Referred by:  RN  Date Referred:  01/03/2014 Referred for  SNF Placement   Other Referral:   Interview type:  Other - See comment Other interview type:   spoke with [facility] as pt admitted from [facility]    PSYCHOSOCIAL DATA Living Status:  FACILITY Admitted from facility:  [facility] Level of care:  Skilled Nursing Facility Primary support name:  [name] Boyle/sister/[phone] Primary support relationship to patient:  SIBLING Degree of support available:   lacking    CURRENT CONCERNS Current Concerns  Post-Acute Placement   Other Concerns:    SOCIAL WORK ASSESSMENT / PLAN CSW received referral that pt admitted from [facility].    CSW reviewed chart and noted that MD feels that goals of care will need to be discussed.    CSW visited pt room and pt sleeping comfortably at this time and no family present.    CSW contacted [facility] who confirmed that pt is a resident at [facility]. Per [facility], pt has limited support and pt main support is  his sister who lives in [place].    [facility] stated that they can accept pt back when pt medically ready for discharge.    CSW to follow up with pt and pt sister regarding disposition planning as pt becomes more medically stable.   Assessment/plan status:  Psychosocial Support/Ongoing Assessment of Needs Other assessment/ plan:   discharge planning   Information/referral to community resources:   Referral back to [facility]    PATIENT'S/FAMILY'S RESPONSE TO PLAN OF CARE: Per chart, pt alert and oriented x 4 but pt sleeping soundly when CSW visited pt room. [facility] reports that pt has been at  facility for over a month and [facility] willing to accept pt back upon discharge if SNF remains recommendation.    Jacklynn Lewis, MSW, LCSW Clinical Social Work [phone]
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
## 2014-01-03 NOTE — Progress Notes (Signed)
Name: Chase [name] R Mcmillon MRN: [id number] DOB: [birth year]    ADMISSION DATE:  01/05/2014   REFERRING MD : EDP PRIMARY SERVICE: PCCM  CHIEF COMPLAINT:  Abd pain, epistaxis, FTT  BRIEF PATIENT DESCRIPTION:  73 yo just discharged(12-17-13) with similar complaints of epistaxis , elevated INR (on Coumadin for PE) and general FTT. Hypotensive, BNP >33K, K+ >6.9 and with metabolic acidosis. PCCM called to admit and due to acute on chronic medical problems we will admit to ICU. Chest X ray is concerning for Pna and he appears to aspirate water in ER.  SIGNIFICANT EVENTS / STUDIES:  1-5 swallow eval>>  LINES / TUBES:  CULTURES: 1-5 bc x 2>> 1-2 UC>>  ANTIBIOTICS: 1-5 vanc>> 1-2 cefepime>>  HISTORY OF PRESENT ILLNESS:  73 yo just discharged with similar complaints of epistaxis , elevated INR (on Coumadin for PE) and general FTT. Hypotensive, BNP >33K, K+ >6.9 and with metabolic acidosis. PCCM called to admit and due to acute on chronic medical problems we will admit to ICU. Chest X ray is concerning for Pna and he appears to aspirate water in ER.  SUBJECTIVE:  Breathing comfortably Feels globally weak, states he is dehydrated and thirsty  VITAL SIGNS: Temp:  [96.8 F (36 C)-98.1 F (36.7 C)] 97.1 F (36.2 C) (01/06 0356) Pulse Rate:  [25-76] 42 (01/06 0600) Resp:  [13-27] 20 (01/06 0916) BP: (62-110)/(33-73) 85/54 mmHg (01/06 0916) SpO2:  [4 %-100 %] 100 % (01/06 0600) Weight:  [76.4 kg (168 lb 6.9 oz)] 76.4 kg (168 lb 6.9 oz) (01/06 0356) HEMODYNAMICS:   VENTILATOR SETTINGS:   INTAKE / OUTPUT: Intake/Output     01/05 0701 - 01/06 0700 01/06 0701 - 01/07 0700   I.V. (mL/kg) 900 (11.8) 100 (1.3)   IV Piggyback 450    Total Intake(mL/kg) 1350 (17.7) 100 (1.3)   Urine (mL/kg/hr) 615 (0.3) 90 (0.3)   Total Output 615 90   Net +735 +10        Stool Occurrence 3 x      PHYSICAL EXAMINATION: General:  Frail cachetic male Neuro:  Appears intact HEENT:  No JVD,  LAN Cardiovascular:  HSR Lungs:  Bibasilar crackles Abdomen:  +bs, ecchymosis on llq abd  Musculoskeletal:  Intact, muscle wasting Skin:  warm  LABS:  CBC  Recent Labs Lab 01/02/14 0035 01/02/14 0102 01/02/14 0829 01/03/14 0415  WBC 5.1  --   --  6.0  HGB 8.9* 10.5* 9.9* 9.7*  HCT 27.0* 31.0* 29.0* 30.1*  PLT 241  --   --  227   Coag's  Recent Labs Lab 01/02/14 0035 01/02/14 0955 01/02/14 1852  APTT  --  66*  --   INR >10.00* 9.04* 6.89*   BMET  Recent Labs Lab 01/02/14 0035  01/02/14 0829 01/02/14 2215 01/03/14 0415  NA 133*  < > 136* 137 136*  K 7.6*  < > 6.0* 5.2 5.0  CL 99  < > 104 103 102  CO2 18*  --   --  22 20  BUN 63*  < > 62* 57* 55*  CREATININE 1.26  < > 1.20 1.00 0.97  GLUCOSE 114*  < > 37* 86 81  < > = values in this interval not displayed. Electrolytes  Recent Labs Lab 01/02/14 0035 01/02/14 0955 01/02/14 2215 01/03/14 0415  CALCIUM 8.6  --  8.4 8.4  MG  --  2.3  --   --   PHOS  --  4.5  --   --  Sepsis Markers  Recent Labs Lab 01/02/14 0104 01/02/14 0955  LATICACIDVEN 5.64*  --   PROCALCITON  --  1.68   ABG  Recent Labs Lab 01/02/14 0013 01/02/14 1455  PHART 7.330* 7.357  PCO2ART 31.2* 38.4  PO2ART 148.0* 98.4   Liver Enzymes  Recent Labs Lab 01/02/14 0035  AST 28  ALT 18  ALKPHOS 142*  BILITOT 2.5*  ALBUMIN 2.6*   Cardiac Enzymes No results found for this basename: TROPONINI, PROBNP,  in the last 168 hours Glucose  Recent Labs Lab 01/02/14 1950  GLUCAP 90    Imaging Dg Chest Port 1 View  01/02/2014   CLINICAL DATA:  Weakness and hyper it  EXAM: PORTABLE CHEST - 1 VIEW  COMPARISON:  12/15/2013  FINDINGS: Cardiopericardial enlargement, similar to prior. Pericardial effusion is not excluded, but was not been seen on November 23, 2013 CT. Decreasing or layering right pleural effusion. Persistent left pleural effusion. No definitive edema.  IMPRESSION: 1. Chronic cardiomegaly and pleural effusions.  No  definitive edema. 2. Pleural fluid could obscure underlying pneumonia, especially on the left.   Electronically Signed   By: Tiburcio Pea M.D.   On: 01/02/2014 01:51     CXR: see above  ASSESSMENT / PLAN:  PULMONARY A:  Hx PE Pulm HTN Possible LLL infiltrate/HCAP P:   O2 as needed Will need to discuss goals of care Treat suspected pna as below Epistaxis > reverse coagulopathy and defer anticoag for now; ? Whether he will require an IVC filter given apparent difficulty/intolerance w coumadin Will have ENT eval for packing if bleeding restarts  CARDIOVASCULAR A: Hypotension Systolic CHF and CM EF 15% 12-14, troponins negative Secondary PAH P:  No diuresis till BP will allow Careful with volume replacement  RENAL  Recent Labs Lab 01/02/14 0829 01/02/14 2215 01/03/14 0415  K 6.0* 5.2 5.0    Lab Results  Component Value Date   CREATININE 0.97 01/03/2014   CREATININE 1.00 01/02/2014   CREATININE 1.20 01/02/2014    A:  Hyperkalemia (on bid potassium at [facility])      Metabolic acidosis with resp compensation P:   Stop K dur Kayexalate given in ED Follow K+  Follow pH  GASTROINTESTINAL A:  ? Aspiration, malnourishment  P:   Swallow eval 1/6 PPI  HEMATOLOGIC A:  Elevated INR secondary to coumadin; Interestingly per Dr Arlyce Dice at [facility], pt received Vit K 5mg  on 1/2 for INR > 7, yet his INR continued to rise.  P:  Vit K Repeat INR Hold coumadin for now, will need to decide whether he can be anticoagulated safely Will ask hematology to evaluate given the rise in his INR even after VitK  INFECTIOUS A:  Presumed LLL HCAP P:   Empiric vanco + cefepime Trend procalcitonin   ENDOCRINE A:  No acute issue, cortisol normal P:     NEUROLOGIC A:  No acute issue P:    FAILURE TO THRIVE A: Suspect largely related to cardiomyopathy, but degree of wt loss and debilitation seems profound. Consider occult malignancy P: - will perform CT scan chest, abd, pelvis to look  for occult CA  TODAY'S SUMMARY: epistaxis due to coumadin coagulopathy, hx CM, PE. Change to SDU status   Levy Pupa, MD, PhD 01/03/2014, 10:44 AM [facility] [phone] or if no answer [phone]

## 2014-01-04 DIAGNOSIS — R0902 Hypoxemia: Secondary | ICD-10-CM

## 2014-01-04 DIAGNOSIS — M79609 Pain in unspecified limb: Secondary | ICD-10-CM

## 2014-01-04 DIAGNOSIS — I509 Heart failure, unspecified: Secondary | ICD-10-CM

## 2014-01-04 DIAGNOSIS — D62 Acute posthemorrhagic anemia: Secondary | ICD-10-CM

## 2014-01-04 LAB — PT FACTOR INHIBITOR (MIXING STUDY)
PATIENT-1/1, INCUBATED MIX-PT: 13.2 s
POSTINC11: 16.7 s
PT, Mixing Interp: NOT DETECTED
Patient post incubation-ptmix: 54.8 seconds
Patient-1/1, Immediate Mix-PT: 12.9 seconds
Protime: 60.7 seconds — ABNORMAL HIGH (ref 8.9–12.1)

## 2014-01-04 LAB — BASIC METABOLIC PANEL
BUN: 54 mg/dL — AB (ref 6–23)
CHLORIDE: 101 meq/L (ref 96–112)
CO2: 20 meq/L (ref 19–32)
CREATININE: 1.07 mg/dL (ref 0.50–1.35)
Calcium: 8.3 mg/dL — ABNORMAL LOW (ref 8.4–10.5)
GFR calc Af Amer: 79 mL/min — ABNORMAL LOW (ref >90)
GFR calc non Af Amer: 68 mL/min — ABNORMAL LOW (ref >90)
Glucose, Bld: 114 mg/dL — ABNORMAL HIGH (ref 70–99)
Potassium: 4.4 mEq/L (ref 3.7–5.3)
Sodium: 136 mEq/L — ABNORMAL LOW (ref 137–147)

## 2014-01-04 LAB — PTT FACTOR INHIBITOR (MIXING STUDY)
1 HR INCUB PT 11NP: 29 s
Interpretation-PTT Mixing Study: NOT DETECTED
PTT: 58 seconds — ABNORMAL HIGH (ref <30)
Patient post incubation: 71.1 seconds
Patient-1/1, Incubated Mix-PTT: 32 seconds
Post inc 1:1 mix, npp + pt: 41 seconds

## 2014-01-04 LAB — PROTIME-INR
INR: 5.64 (ref 0.00–1.49)
Prothrombin Time: 48.7 seconds — ABNORMAL HIGH (ref 11.6–15.2)

## 2014-01-04 LAB — CBC
HEMATOCRIT: 29.3 % — AB (ref 39.0–52.0)
Hemoglobin: 9.4 g/dL — ABNORMAL LOW (ref 13.0–17.0)
MCH: 34.7 pg — AB (ref 26.0–34.0)
MCHC: 32.1 g/dL (ref 30.0–36.0)
MCV: 108.1 fL — AB (ref 78.0–100.0)
Platelets: ADEQUATE 10*3/uL (ref 150–400)
RBC: 2.71 MIL/uL — ABNORMAL LOW (ref 4.22–5.81)
RDW: 20.1 % — AB (ref 11.5–15.5)
WBC: 4.3 10*3/uL (ref 4.0–10.5)

## 2014-01-04 MED ORDER — SODIUM CHLORIDE 0.9 % IV BOLUS (SEPSIS)
500.0000 mL | Freq: Once | INTRAVENOUS | Status: AC
  Administered 2014-01-04: 500 mL via INTRAVENOUS

## 2014-01-04 MED ORDER — ONDANSETRON HCL 4 MG/2ML IJ SOLN
4.0000 mg | Freq: Four times a day (QID) | INTRAMUSCULAR | Status: DC | PRN
  Administered 2014-01-04: 4 mg via INTRAVENOUS
  Filled 2014-01-04: qty 2

## 2014-01-04 NOTE — Progress Notes (Signed)
Returned call to sister [name] at [phone] and undated her concerning patient status.

## 2014-01-04 NOTE — Progress Notes (Signed)
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 No evidence for inhibitor to coagulation. Coagulopathy will continue to improve with Vitamin K replacement.  When INR<3, I would start lovenox 1.5 mg/kg/day SQ.

## 2014-01-04 NOTE — Progress Notes (Addendum)
Name: Chase [name] MRN: [id number] DOB: [birth year]    ADMISSION DATE:  01/14/2014   REFERRING MD : EDP PRIMARY SERVICE: PCCM  CHIEF COMPLAINT:  Abd pain, epistaxis, FTT  BRIEF PATIENT DESCRIPTION:  73 yo just discharged (12-17-13) with similar complaints of epistaxis , elevated INR (on Coumadin for PE) and general FTT. Hypotensive, BNP >33K, K+ >6.9 and with metabolic acidosis. PCCM called to admit and due to acute on chronic medical problems we will admit to ICU. Chest X ray is concerning for Pna and he appears to aspirate water in ER.  SIGNIFICANT EVENTS / STUDIES:  1-5 swallow eval>>no overt aspiration  LINES / TUBES:  CULTURES: 1-5 bc x 2>> 1-2 UC>>neg  ANTIBIOTICS: 1-5 vanc>> 1-2 cefepime>>  HISTORY OF PRESENT ILLNESS:  73 yo just discharged with similar complaints of epistaxis , elevated INR (on Coumadin for PE) and general FTT. Hypotensive, BNP >33K, K+ >6.9 and with metabolic acidosis. PCCM called to admit and due to acute on chronic medical problems we will admit to ICU. Chest X ray is concerning for Pna and he appears to aspirate water in ER.  SUBJECTIVE:  Choked with liquids. Looks even weaker than on admit.  VITAL SIGNS: Temp:  [96.6 F (35.9 C)-98.2 F (36.8 C)] 97.3 F (36.3 C) (01/07 0800) Resp:  [16-31] 26 (01/07 0930) BP: (78-114)/(52-72) 114/64 mmHg (01/07 0800) SpO2:  [100 %] 100 % (01/07 0930) Weight:  [169 lb 1.5 oz (76.7 kg)] 169 lb 1.5 oz (76.7 kg) (01/07 0300) HEMODYNAMICS:   VENTILATOR SETTINGS:   INTAKE / OUTPUT: Intake/Output     01/06 0701 - 01/07 0700 01/07 0701 - 01/08 0700   I.V. (mL/kg) 825 (10.8) 100 (1.3)   IV Piggyback 500    Total Intake(mL/kg) 1325 (17.3) 100 (1.3)   Urine (mL/kg/hr) 415 (0.2) 25 (0.1)   Total Output 415 25   Net +910 +75          PHYSICAL EXAMINATION: General:  Frail cachetic male Neuro:  Appears intact HEENT:  No JVD, LAN Cardiovascular:  HSR Lungs:  Bibasilar crackles Abdomen:  +bs, ecchymosis on  llq abd  Musculoskeletal:  Intact, muscle wasting Skin:  Warm. Left foot no pedal pulse. ++ le edema l>r. INR>5  LABS:  CBC  Recent Labs Lab 01/02/14 0035  01/02/14 0829 01/03/14 0415 01/04/14 0335  WBC 5.1  --   --  6.0 4.3  HGB 8.9*  < > 9.9* 9.7* 9.4*  HCT 27.0*  < > 29.0* 30.1* 29.3*  PLT 241  --   --  227 PLATELET CLUMPS NOTED ON SMEAR, COUNT APPEARS ADEQUATE  < > = values in this interval not displayed. Coag's  Recent Labs Lab 01/02/14 0955 01/02/14 1852 01/04/14 0335  APTT 66*  --   --   INR 9.04* 6.89* 5.64*   BMET  Recent Labs Lab 01/02/14 2215 01/03/14 0415 01/04/14 0335  NA 137 136* 136*  K 5.2 5.0 4.4  CL 103 102 101  CO2 22 20 20   BUN 57* 55* 54*  CREATININE 1.00 0.97 1.07  GLUCOSE 86 81 114*   Electrolytes  Recent Labs Lab 01/02/14 0955 01/02/14 2215 01/03/14 0415 01/04/14 0335  CALCIUM  --  8.4 8.4 8.3*  MG 2.3  --   --   --   PHOS 4.5  --   --   --    Sepsis Markers  Recent Labs Lab 01/02/14 0104 01/02/14 0955  LATICACIDVEN 5.64*  --   PROCALCITON  --  1.68   ABG  Recent Labs Lab 01/02/14 0013 01/02/14 1455  PHART 7.330* 7.357  PCO2ART 31.2* 38.4  PO2ART 148.0* 98.4   Liver Enzymes  Recent Labs Lab 01/02/14 0035  AST 28  ALT 18  ALKPHOS 142*  BILITOT 2.5*  ALBUMIN 2.6*   Cardiac Enzymes No results found for this basename: TROPONINI, PROBNP,  in the last 168 hours Glucose  Recent Labs Lab 01/02/14 1950  GLUCAP 90    Imaging Ct Chest W Contrast  01/03/2014   CLINICAL DATA:  Hemoptysis. Epistaxis. Acute respiratory failure. Recent pulmonary embolism.  EXAM: CT CHEST, ABDOMEN, AND PELVIS WITH CONTRAST  TECHNIQUE: Multidetector CT imaging of the chest, abdomen and pelvis was performed following the standard protocol during bolus administration of intravenous contrast.  CONTRAST:  OMNIPAQUE IOHEXOL 300 MG/ML  SOLN  COMPARISON:  Chest CTA on 11/23/2013  FINDINGS: CT CHEST FINDINGS  Increase in moderate  pleural effusions and compressive atelectasis seen bilaterally, as well as new moderate pericardial effusion. Cardiomegaly is stable. Large volume pulmonary embolism is again seen in the right pulmonary artery, and is similar in appearance to previous study. No new pulmonary embolism visualized. No evidence of thoracic aortic aneurysm or dissection.  Aerated portions of both lungs show no definite pulmonary mass or consolidation. No hilar or mediastinal masses identified.  CT ABDOMEN AND PELVIS FINDINGS  Significant reflux of contrast into IVC and hepatic veins is again demonstrated, consistent with right heart insufficiency. There is poor contrast opacification of the abdominal parenchymal organs likely secondary to poor cardiac output. Diffuse body wall edema and mild ascites is demonstrated.  Gallbladder is unremarkable. Small hepatic and renal cysts again noted. No definite masses are identified. No focal inflammatory process or abscess identified. No evidence of dilated bowel loops. Foley catheter is seen within the urinary bladder. Small bilateral inguinal hernias noted containing only fat. No evidence herniated bowel. No suspicious bone lesions identified.  IMPRESSION: Persistent large volume pulmonary embolism in the right pulmonary artery, without significant change compared to prior study. No new pulmonary embolism identified.  Increased moderate bilateral pleural effusions and pericardial effusion. Increased bilateral compressive atelectasis.  Stable cardiomegaly. Reflux of contrast into hepatic veins and poor opacification of the abdominal parenchymal organs is consistent with right heart insufficiency and poor cardiac output.  Diffuse body wall edema and mild ascites.  No soft tissue masses or lymphadenopathy identified.   Electronically Signed   By: Myles Rosenthal M.D.   On: 01/03/2014 16:48   Ct Abdomen Pelvis W Contrast  01/03/2014   CLINICAL DATA:  Hemoptysis. Epistaxis. Acute respiratory failure.  Recent pulmonary embolism.  EXAM: CT CHEST, ABDOMEN, AND PELVIS WITH CONTRAST  TECHNIQUE: Multidetector CT imaging of the chest, abdomen and pelvis was performed following the standard protocol during bolus administration of intravenous contrast.  CONTRAST:  OMNIPAQUE IOHEXOL 300 MG/ML  SOLN  COMPARISON:  Chest CTA on 11/23/2013  FINDINGS: CT CHEST FINDINGS  Increase in moderate pleural effusions and compressive atelectasis seen bilaterally, as well as new moderate pericardial effusion. Cardiomegaly is stable. Large volume pulmonary embolism is again seen in the right pulmonary artery, and is similar in appearance to previous study. No new pulmonary embolism visualized. No evidence of thoracic aortic aneurysm or dissection.  Aerated portions of both lungs show no definite pulmonary mass or consolidation. No hilar or mediastinal masses identified.  CT ABDOMEN AND PELVIS FINDINGS  Significant reflux of contrast into IVC and hepatic veins is again demonstrated, consistent with right heart  insufficiency. There is poor contrast opacification of the abdominal parenchymal organs likely secondary to poor cardiac output. Diffuse body wall edema and mild ascites is demonstrated.  Gallbladder is unremarkable. Small hepatic and renal cysts again noted. No definite masses are identified. No focal inflammatory process or abscess identified. No evidence of dilated bowel loops. Foley catheter is seen within the urinary bladder. Small bilateral inguinal hernias noted containing only fat. No evidence herniated bowel. No suspicious bone lesions identified.  IMPRESSION: Persistent large volume pulmonary embolism in the right pulmonary artery, without significant change compared to prior study. No new pulmonary embolism identified.  Increased moderate bilateral pleural effusions and pericardial effusion. Increased bilateral compressive atelectasis.  Stable cardiomegaly. Reflux of contrast into hepatic veins and poor opacification  of the abdominal parenchymal organs is consistent with right heart insufficiency and poor cardiac output.  Diffuse body wall edema and mild ascites.  No soft tissue masses or lymphadenopathy identified.   Electronically Signed   By: Myles RosenthalJohn  Stahl M.D.   On: 01/03/2014 16:48     CXR: see above  ASSESSMENT / PLAN:  PULMONARY A:  Hx PE, Note CT chest 1-6 shows no shrinkage of pe Pulm HTN Possible LLL infiltrate/HCAP P:   O2 as needed Will need to discuss goals of care Treat suspected pna as below Epistaxis > reverse coagulopathy and defer anticoag for now; ? Whether he will require an IVC filter given apparent difficulty/intolerance w coumadin Will have ENT eval for packing if bleeding restarts  CARDIOVASCULAR A: Hypotension Systolic CHF and CM EF 15% 12-14, troponins negative Secondary PAH L foot hypoperfusion P:  No diuresis till BP will allow Careful with volume replacement check arterial doppler L LE  RENAL  Recent Labs Lab 01/02/14 2215 01/03/14 0415 01/04/14 0335  K 5.2 5.0 4.4    Lab Results  Component Value Date   CREATININE 1.07 01/04/2014   CREATININE 0.97 01/03/2014   CREATININE 1.00 01/02/2014    Recent Labs Lab 01/02/14 2215 01/03/14 0415 01/04/14 0335  K 5.2 5.0 4.4   A:  Hyperkalemia (on bid potassium at NHP)      Metabolic acidosis with resp compensation P:   Stop K dur Kayexalate given in ED Follow K+  Follow pH  GASTROINTESTINAL A:  ? Aspiration, malnourishment  P:   Swallow eval 1/6, no overt aspiration  PPI Ct ABD nad 1-6  HEMATOLOGIC Lab Results  Component Value Date   INR 5.64* 01/04/2014   INR 6.89* 01/02/2014   INR 9.04* 01/02/2014    A:  Elevated INR secondary to coumadin; Interestingly [facility], pt received Vit K 5mg  on 1/2 for INR > 7, yet his INR continued to rise.  P:  Vit K Repeat INR Hold coumadin for now, will need to decide whether he can be anticoagulated safely Will ask hematology to evaluate given the  rise in his INR even after VitK Trend INR   INFECTIOUS A:  Presumed LLL HCAP P:   Empiric vanco + cefepime Trend procalcitonin    ENDOCRINE A:  No acute issue, cortisol normal P:     NEUROLOGIC A:  No acute issue P:    FAILURE TO THRIVE A: Suspect largely related to cardiomyopathy, but degree of wt loss and debilitation seems profound. Consider occult malignancy P: -  CT scan chest, abd, pelvis to look for occult CA >> 1-6 NEGATIVE.  TODAY'S SUMMARY: epistaxis due to coumadin coagulopathy, hx CM, PE, resolved. ? Cause of FTT. Left foot  with no DP pulse despite inr> 5. EF 15 % 11-14 he may be in low flow state from CM. We could ct head/neck ro cancer for completeness.  I spoke with his sister, [name], about goals of care. She confirms that he would not want CPR, MV, ACLS. I will indicate DNR orders in the chart  35 minutes CC time  [facility] ACNP Adolph Pollack PCCM Pager [phone] till 3 pm If no answer page [phone] 01/04/2014, 10:52 AM  Levy Pupa, MD, PhD 01/04/2014, 12:13 PM [facility] [phone] or if no answer [phone]

## 2014-01-04 NOTE — Plan of Care (Signed)
Problem: Phase II Progression Outcomes Goal: Progress activity as tolerated unless otherwise ordered Outcome: Not Progressing Patient is very weak and pale.   Goal: Tolerating diet Outcome: Not Progressing Very poor appetite.  Also afraid of aspirating.  Problem: Phase III Progression Outcomes Goal: Pain controlled on oral analgesia Outcome: Progressing Denies pain.

## 2014-01-04 NOTE — Progress Notes (Signed)
VASCULAR LAB PRELIMINARY  PRELIMINARY  PRELIMINARY  PRELIMINARY  Left lower extremity arterial duplex completed.    Preliminary report:  Duplex scan of the left lower extremity revealed mild diffuse heterogeneous plaque throughout with no evidence of significant stenosis. Doppler waveforms were triphasic throughout. Mild technical difficulty due to cardiac arrythmia.  Alegra Rost, RVS 01/04/2014, 4:17 PM

## 2014-01-04 NOTE — Progress Notes (Addendum)
Speech Language Pathology Treatment: Dysphagia  Patient Details Name: Chase [name] MRN: [id number] DOB: January 10, 1941 Today's Date: 01/04/2014 Time: 5701-7793 SLP Time Calculation (min): 35 min  Assessment / Plan / Recommendation Clinical Impression  Pt today appears more ashen and with worsening hoarseness today.  Pt reports aspiration episode of "kool aid" early in the am, which he attributed to talking while trying to consume liquids.  Pt complains of excessive thirst and currently is unable to feed himself- which raises his aspiration risk.  SLP provided pt with trials of ice, nectar and thin via tsp, cup, straw.  Intermittent throat clearing noted after swallows of thin without expectoration.  Pt may be infiltrating larynx with liquids, but can not definitively determine via bedside.  Informed pt that thickener can be ordered for his use, if he deems it helpful and at this time, pt declined.    Pt reports poor appetite x6 weeks and that he "used to enjoy eating."  Now admits to frequent "choking" even prior to admission.   SLP questions source of pt's hoarseness, ? If Pulmonary embolism could be impacting vagal nerve.  Pt adamantly denies nasal bleeding with this admission and admits to vocal hoarseness off and on for a few weeks.      If  Md desires, MBS may be completed to allow instrumental evaluation of swallowing and rule out OVERT aspiration given neurological hx including dysphagia.    SLP to follow up with pt, recommend continue strict aspiration precautions.  Spoke to RN re: findings and recommendations.  Note possible plan to establish goals of care.   HPI HPI: 73 yo male adm to [facility] with epistaxis, Abdominal pain, FTT.  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  Pt resides in SNF and was seeing PT/OT for therapy.  He was on a regular/thin- NAS diet and did not have difficulties per SLP discussion with SNF  RN.  Pt CXR concerning for pna.  Swallow evaluation completed yesterday with pt finding chronic aspiration risk factors but recommendations for regular/thin diet due to pt awareness.  Today pt seen to assess tolerance of po diet and for further education.     Pertinent Vitals Afebrile, decreased  SLP Plan  Continue with current plan of care    Recommendations Diet recommendations: Regular;Thin liquid Liquids provided via: Straw;Cup;Teaspoon (use tsps if indicated and decreases cough) Medication Administration: Whole meds with puree Supervision: Staff to assist with self feeding Compensations: Slow rate;Small sips/bites              Oral Care Recommendations: Oral care BID Follow up Recommendations: Skilled Nursing facility Plan: Continue with current plan of care    GO     Mills Koller, MS [facility] SLP [phone]

## 2014-01-04 NOTE — Progress Notes (Signed)
eLink Physician-Brief Progress Note Patient Name: NEIKO WIK DOB: 1941/02/17 MRN: [id number]  Date of Service  01/04/2014   HPI/Events of Note   Hypotension Per earlier conversation with rounding MD - DNR/DNI Patient rejects the idea of central line or vasopressors   eICU Interventions   NS 500 x 1   Intervention Category Intermediate Interventions: Communication with other healthcare providers and/or family  Lonia Farber 01/04/2014, 11:46 PM

## 2014-01-05 DIAGNOSIS — E43 Unspecified severe protein-calorie malnutrition: Secondary | ICD-10-CM

## 2014-01-05 DIAGNOSIS — I5022 Chronic systolic (congestive) heart failure: Secondary | ICD-10-CM

## 2014-01-05 LAB — HIV ANTIBODY (ROUTINE TESTING W REFLEX): HIV: NONREACTIVE

## 2014-01-05 LAB — BASIC METABOLIC PANEL
BUN: 55 mg/dL — ABNORMAL HIGH (ref 6–23)
CHLORIDE: 99 meq/L (ref 96–112)
CO2: 21 mEq/L (ref 19–32)
CREATININE: 1.17 mg/dL (ref 0.50–1.35)
Calcium: 8.3 mg/dL — ABNORMAL LOW (ref 8.4–10.5)
GFR calc non Af Amer: 61 mL/min — ABNORMAL LOW (ref >90)
GFR, EST AFRICAN AMERICAN: 71 mL/min — AB (ref >90)
Glucose, Bld: 113 mg/dL — ABNORMAL HIGH (ref 70–99)
Potassium: 4.1 mEq/L (ref 3.7–5.3)
Sodium: 133 mEq/L — ABNORMAL LOW (ref 137–147)

## 2014-01-05 LAB — CBC
HEMATOCRIT: 29.1 % — AB (ref 39.0–52.0)
Hemoglobin: 9 g/dL — ABNORMAL LOW (ref 13.0–17.0)
MCH: 33.7 pg (ref 26.0–34.0)
MCHC: 30.9 g/dL (ref 30.0–36.0)
MCV: 109 fL — AB (ref 78.0–100.0)
Platelets: 235 10*3/uL (ref 150–400)
RBC: 2.67 MIL/uL — ABNORMAL LOW (ref 4.22–5.81)
RDW: 20.1 % — ABNORMAL HIGH (ref 11.5–15.5)
WBC: 5.1 10*3/uL (ref 4.0–10.5)

## 2014-01-05 LAB — PROCALCITONIN: Procalcitonin: 1 ng/mL

## 2014-01-05 LAB — VANCOMYCIN, TROUGH: Vancomycin Tr: 32.1 ug/mL (ref 10.0–20.0)

## 2014-01-05 MED ORDER — DIGOXIN 0.25 MG/ML IJ SOLN
0.0630 mg | Freq: Every day | INTRAMUSCULAR | Status: DC
  Administered 2014-01-06 – 2014-01-07 (×2): 0.063 mg via INTRAVENOUS
  Filled 2014-01-05 (×6): qty 0.5

## 2014-01-05 NOTE — Progress Notes (Signed)
CSW continuing to follow for pt disposition planning.  Pt admitted from [facility].  CSW attempted to reach pt sister, [name] via telephone and left voice message.  CSW updated [employer].  Pt transferred to room 1539 and ICU CSW provided report to unit CSW.  CSW to continue to follow.  Jacklynn Lewis, MSW, LCSW Clinical Social Work [phone]

## 2014-01-05 NOTE — Progress Notes (Signed)
Patient AE:SLPNP Chase [name]      DOB: 11-10-1941      YYF:[id number]  Patient known to me from previous goals of care in last few months.  His sister is on her way down from [place] .  I will set an appointment as soon as I can catch up with her.  He appears to be significantly declined from my last visit.   [name] L. Ladona Ridgel, MD MBA The Palliative Medicine Team at [facility] Phone: [phone] Pager: [phone]

## 2014-01-05 NOTE — Progress Notes (Signed)
CARE MANAGEMENT NOTE 01/05/2014  Patient:  Chase [name], Chase [name]   Account Number:  [id number]  Date Initiated:  01/02/2014  Documentation initiated by:  Mckenzie Bove  Subjective/Objective Assessment:   pt with abnormal pt and inr, epitaxis and hypoxia, abnormal labs     Action/Plan:   home when stable   Anticipated DC Date:  01/05/2014   Anticipated DC Plan:  SKILLED NURSING FACILITY  In-house referral  Clinical Social Worker      DC Planning Services  NA      [facility] Choice  NA   Choice offered to / List presented to:  NA   DME arranged  NA      DME agency  NA     HH arranged  NA      HH agency  NA   Status of service:  In process, will continue to follow Medicare Important Message given?  NA - LOS <3 / Initial given by admissions (If response is "NO", the following Medicare IM given date fields will be blank) Date Medicare IM given:   Date Additional Medicare IM given:    Discharge Disposition:    Per UR Regulation:  Reviewed for med. necessity/level of care/duration of stay  If discussed at Long Length of Stay Meetings, dates discussed:    Comments:  [id number]/Addie Cederberg Stark Jock, BSN, [place] [phone] Chart Reviewed for discharge and hospital needs. Discharge needs at time of review:  None present will follow for needs. patient moved to med surg bed [id number]. Review of patient progress due on [id number].  [id number]/Molly Maselli Earlene Plater, RN, BSN, CCM, [phone] Chart reviewed for update of needs and condition. INR still highly elvated  at 5.6  [id number]/Ameyah Bangura Stark Jock, BSN, [place] [phone] Chart Reviewed for discharge and hospital needs. Discharge needs at time of review:  None present will follow for needs. Review of patient progress due on [id number].

## 2014-01-05 NOTE — Progress Notes (Signed)
ANTIBIOTIC CONSULT NOTE - FOLLOW UP  Pharmacy Consult for Vancomycin, Cefepime Indication: sepsis, HCAP  No Known Allergies  Patient Measurements: Height: 6' 3.5" (191.8 cm) Weight: 176 lb 9.4 oz (80.1 kg) IBW/kg (Calculated) : 85.65  Labs:  Recent Labs  01/03/14 0415 01/04/14 0335 01/05/14 0350  WBC 6.0 4.3 5.1  HGB 9.7* 9.4* 9.0*  PLT 227 PLATELET CLUMPS NOTED ON SMEAR, COUNT APPEARS ADEQUATE 235  CREATININE 0.97 1.07 1.17   Estimated Creatinine Clearance: 65.6 ml/min (by C-G formula based on Cr of 1.17).  Recent Labs  01/05/14 1157  VANCOTROUGH 32.1*      Assessment: 72 yoM discharged [facility] after treatment for ABLA 2/2 epistaxis in the setting of supratherapeutic INR. Pt returned 1/4 with c/o hypotension. In the ED, found with BP 82/58, hyperkalemia, INR < 10, lactate > 5, CXR cannot rule out PNA. Treating for HCAP with Vanc/Cefepime per Rx dosing. Appears to have aspirate water in the ER per MD notes.   1/5 >> Vanc >> 1/5 >> Cefepime >>   Tmax: afeb WBCs: wnl Renal: Scr 1.0, CG 66, N 69 PCT improving 1.69 ->> 1 (1/8)  1/5 strep pneumo ag >> neg 1/5 legionella ag >> neg 1/5 mrsa pcr >> positive 1/5 urine >> NGF 1/5 blood x 2 >> NGTD  Today is D#4 Vancomycin, Cefepime.  No new CXR since 1/5. Dr. Delton Coombes thinks will continue current abx for full 7 days of therapy. Vancomycin trough today supratherapeutic at 32.1 on vancomycin 1g Iv q12h. Vancomycin dose at noon not yet given.   Goal of Therapy:  Vancomycin trough level 15-20 mcg/ml  Plan:   Stop Vancomycin for now   Repeat random vancomycin level in AM  Continue cefepime as ordered  Geoffry Paradise, PharmD, BCPS Pager: [phone] 1:08 PM Pharmacy #: [id number]

## 2014-01-05 NOTE — Progress Notes (Signed)
Patient removed IV site in right upper arm by accident, refused new IV site.

## 2014-01-05 NOTE — Progress Notes (Addendum)
Name: Chase [name] MRN: [id number] DOB: 1941/03/11    ADMISSION DATE:  01/16/2014   REFERRING MD : EDP PRIMARY SERVICE: PCCM  CHIEF COMPLAINT:  Abd pain, epistaxis, FTT  BRIEF PATIENT DESCRIPTION:  73 yo just discharged (12-17-13) with similar complaints of epistaxis , elevated INR (on Coumadin for PE) and general FTT. Hypotensive, BNP >33K, K+ >6.9 and with metabolic acidosis. PCCM called to admit and due to acute on chronic medical problems we will admit to ICU. Chest X ray is concerning for Pna and he appears to aspirate water in ER.  SIGNIFICANT EVENTS / STUDIES:  swallow eval 1/5 >>no overt aspiration CT C/A/P 1/6 >> B effusions, stable PE clot burden, CM, diffuse body wall edema, ascites, no malignancy noted L LE arterial doppler 1/7 >> no evidence significant obstruction  LINES / TUBES:  CULTURES: 1-5 bc x 2>> 1-2 UC>>neg  ANTIBIOTICS: 1-5 vanc>> 1-2 cefepime>>  HISTORY OF PRESENT ILLNESS:  73 yo just discharged with similar complaints of epistaxis , elevated INR (on Coumadin for PE) and general FTT. Hypotensive, BNP >33K, K+ >6.9 and with metabolic acidosis. PCCM called to admit and due to acute on chronic medical problems we will admit to ICU. Chest X ray is concerning for Pna and he appears to aspirate water in ER.  SUBJECTIVE:  Lost IV access 1/7 and pt didn't want replaced Poor PO Very weak but awake and alert  VITAL SIGNS: Temp:  [97 F (36.1 C)-97.6 F (36.4 C)] 97 F (36.1 C) (01/08 0800) Resp:  [15-26] 26 (01/08 0600) BP: (81-122)/(63-101) 90/65 mmHg (01/08 0400) SpO2:  [99 %] 99 % (01/07 1600) Weight:  [80.1 kg (176 lb 9.4 oz)] 80.1 kg (176 lb 9.4 oz) (01/08 0413) HEMODYNAMICS:   VENTILATOR SETTINGS:   INTAKE / OUTPUT: Intake/Output     01/07 0701 - 01/08 0700 01/08 0701 - 01/09 0700   P.O. 80    I.V. (mL/kg) 1150 (14.4)    IV Piggyback 550    Total Intake(mL/kg) 1780 (22.2)    Urine (mL/kg/hr) 220 (0.1)    Total Output 220     Net +1560             PHYSICAL EXAMINATION: General:  Frail cachetic male Neuro:  Appears intact HEENT:  No JVD, LAN Cardiovascular:  HSR Lungs:  Bibasilar crackles Abdomen:  +bs, ecchymosis on llq abd  Musculoskeletal:  Intact, muscle wasting Skin:  Warm. Left foot no pedal pulse. ++ le edema l>r. INR>5  LABS:  CBC  Recent Labs Lab 01/03/14 0415 01/04/14 0335 01/05/14 0350  WBC 6.0 4.3 5.1  HGB 9.7* 9.4* 9.0*  HCT 30.1* 29.3* 29.1*  PLT 227 PLATELET CLUMPS NOTED ON SMEAR, COUNT APPEARS ADEQUATE 235   Coag's  Recent Labs Lab 01/02/14 0955 01/02/14 1852 01/04/14 0335  APTT 66*  --   --   INR 9.04* 6.89* 5.64*   BMET  Recent Labs Lab 01/03/14 0415 01/04/14 0335 01/05/14 0350  NA 136* 136* 133*  K 5.0 4.4 4.1  CL 102 101 99  CO2 20 20 21   BUN 55* 54* 55*  CREATININE 0.97 1.07 1.17  GLUCOSE 81 114* 113*   Electrolytes  Recent Labs Lab 01/02/14 0955  01/03/14 0415 01/04/14 0335 01/05/14 0350  CALCIUM  --   < > 8.4 8.3* 8.3*  MG 2.3  --   --   --   --   PHOS 4.5  --   --   --   --   < > =  values in this interval not displayed. Sepsis Markers  Recent Labs Lab 01/02/14 0104 01/02/14 0955 01/05/14 0350  LATICACIDVEN 5.64*  --   --   PROCALCITON  --  1.68 1.00   ABG  Recent Labs Lab 01/02/14 0013 01/02/14 1455  PHART 7.330* 7.357  PCO2ART 31.2* 38.4  PO2ART 148.0* 98.4   Liver Enzymes  Recent Labs Lab 01/02/14 0035  AST 28  ALT 18  ALKPHOS 142*  BILITOT 2.5*  ALBUMIN 2.6*   Cardiac Enzymes No results found for this basename: TROPONINI, PROBNP,  in the last 168 hours Glucose  Recent Labs Lab 01/02/14 1950  GLUCAP 90    Imaging Ct Chest W Contrast  01/03/2014   CLINICAL DATA:  Hemoptysis. Epistaxis. Acute respiratory failure. Recent pulmonary embolism.  EXAM: CT CHEST, ABDOMEN, AND PELVIS WITH CONTRAST  TECHNIQUE: Multidetector CT imaging of the chest, abdomen and pelvis was performed following the standard protocol during bolus  administration of intravenous contrast.  CONTRAST:  100mL OMNIPAQUE IOHEXOL 300 MG/ML  SOLN  COMPARISON:  Chest CTA on 11/23/2013  FINDINGS: CT CHEST FINDINGS  Increase in moderate pleural effusions and compressive atelectasis seen bilaterally, as well as new moderate pericardial effusion. Cardiomegaly is stable. Large volume pulmonary embolism is again seen in the right pulmonary artery, and is similar in appearance to previous study. No new pulmonary embolism visualized. No evidence of thoracic aortic aneurysm or dissection.  Aerated portions of both lungs show no definite pulmonary mass or consolidation. No hilar or mediastinal masses identified.  CT ABDOMEN AND PELVIS FINDINGS  Significant reflux of contrast into IVC and hepatic veins is again demonstrated, consistent with right heart insufficiency. There is poor contrast opacification of the abdominal parenchymal organs likely secondary to poor cardiac output. Diffuse body wall edema and mild ascites is demonstrated.  Gallbladder is unremarkable. Small hepatic and renal cysts again noted. No definite masses are identified. No focal inflammatory process or abscess identified. No evidence of dilated bowel loops. Foley catheter is seen within the urinary bladder. Small bilateral inguinal hernias noted containing only fat. No evidence herniated bowel. No suspicious bone lesions identified.  IMPRESSION: Persistent large volume pulmonary embolism in the right pulmonary artery, without significant change compared to prior study. No new pulmonary embolism identified.  Increased moderate bilateral pleural effusions and pericardial effusion. Increased bilateral compressive atelectasis.  Stable cardiomegaly. Reflux of contrast into hepatic veins and poor opacification of the abdominal parenchymal organs is consistent with right heart insufficiency and poor cardiac output.  Diffuse body wall edema and mild ascites.  No soft tissue masses or lymphadenopathy identified.    Electronically Signed   By: Myles RosenthalJohn  Stahl M.D.   On: 01/03/2014 16:48   Ct Abdomen Pelvis W Contrast  01/03/2014   CLINICAL DATA:  Hemoptysis. Epistaxis. Acute respiratory failure. Recent pulmonary embolism.  EXAM: CT CHEST, ABDOMEN, AND PELVIS WITH CONTRAST  TECHNIQUE: Multidetector CT imaging of the chest, abdomen and pelvis was performed following the standard protocol during bolus administration of intravenous contrast.  CONTRAST:  100mL OMNIPAQUE IOHEXOL 300 MG/ML  SOLN  COMPARISON:  Chest CTA on 11/23/2013  FINDINGS: CT CHEST FINDINGS  Increase in moderate pleural effusions and compressive atelectasis seen bilaterally, as well as new moderate pericardial effusion. Cardiomegaly is stable. Large volume pulmonary embolism is again seen in the right pulmonary artery, and is similar in appearance to previous study. No new pulmonary embolism visualized. No evidence of thoracic aortic aneurysm or dissection.  Aerated portions of both lungs show no definite  pulmonary mass or consolidation. No hilar or mediastinal masses identified.  CT ABDOMEN AND PELVIS FINDINGS  Significant reflux of contrast into IVC and hepatic veins is again demonstrated, consistent with right heart insufficiency. There is poor contrast opacification of the abdominal parenchymal organs likely secondary to poor cardiac output. Diffuse body wall edema and mild ascites is demonstrated.  Gallbladder is unremarkable. Small hepatic and renal cysts again noted. No definite masses are identified. No focal inflammatory process or abscess identified. No evidence of dilated bowel loops. Foley catheter is seen within the urinary bladder. Small bilateral inguinal hernias noted containing only fat. No evidence herniated bowel. No suspicious bone lesions identified.  IMPRESSION: Persistent large volume pulmonary embolism in the right pulmonary artery, without significant change compared to prior study. No new pulmonary embolism identified.  Increased moderate  bilateral pleural effusions and pericardial effusion. Increased bilateral compressive atelectasis.  Stable cardiomegaly. Reflux of contrast into hepatic veins and poor opacification of the abdominal parenchymal organs is consistent with right heart insufficiency and poor cardiac output.  Diffuse body wall edema and mild ascites.  No soft tissue masses or lymphadenopathy identified.   Electronically Signed   By: Myles Rosenthal M.D.   On: 01/03/2014 16:48     ASSESSMENT / PLAN:  PULMONARY A:  Hx PE, Note CT chest 1/6 shows no change PE size Pulm HTN Possible LLL infiltrate/HCAP P:   O2 as needed Treat suspected pna as below Epistaxis > reversed coagulopathy and defer anticoag for now; ? Whether he will require an IVC filter given apparent difficulty/intolerance w coumadin Will have ENT eval for packing if bleeding restarts  CARDIOVASCULAR A: Hypotension Systolic CHF and CM EF 15% 12-14, troponins negative Secondary PAH L foot hypoperfusion, arterial doppler negative P:  No diuresis till BP will allow Careful with volume replacement Add back digoxin 1/8   RENAL  Recent Labs Lab 01/03/14 0415 01/04/14 0335 01/05/14 0350  K 5.0 4.4 4.1    Lab Results  Component Value Date   CREATININE 1.17 01/05/2014   CREATININE 1.07 01/04/2014   CREATININE 0.97 01/03/2014    Recent Labs Lab 01/03/14 0415 01/04/14 0335 01/05/14 0350  K 5.0 4.4 4.1   A:  Hyperkalemia (on bid potassium at [facility])      Metabolic acidosis with resp compensation P:   Stop K dur Kayexalate given in ED at time of admission Follow K+  Follow pH  GASTROINTESTINAL A:  ? Aspiration, malnourishment  P:   Swallow eval 1/6, no overt aspiration  PPI Ct ABD nad 1-6  HEMATOLOGIC Lab Results  Component Value Date   INR 5.64* 01/04/2014   INR 6.89* 01/02/2014   INR 9.04* 01/02/2014   PROTIME 60.7* 01/03/2014    A:  Elevated INR secondary to coumadin; Interestingly per Dr Arlyce Dice at [facility], pt received Vit K 5mg  on 1/2  for INR > 7, yet his INR continued to rise.  P:  Vit K given Repeat INR on 1/9 Restart anticoagulation once INR < 2.0 Appreciate DR Granfiortuna's help, mixing studies pending  INFECTIOUS A:  Presumed LLL HCAP P:   Empiric vanco + cefepime, finish a 7 day course and then d/c Trend procalcitonin    ENDOCRINE A:  No acute issue, cortisol normal P:     NEUROLOGIC A:  No acute issue P:    FAILURE TO THRIVE A: Suspect largely related to cardiomyopathy, but degree of wt loss and debilitation seems profound. Consider occult malignancy P: -  CT scan chest, abd,  pelvis to look for occult CA >> 1-6 NEGATIVE.  TODAY'S SUMMARY: epistaxis due to coumadin coagulopathy, hx CM, PE, resolved. ? Cause of FTT. Left foot with no DP pulse despite inr> 5. EF 15 % 11-14 he may be in low flow state from CM. We could ct head/neck ro cancer for completeness.  I spoke with his sister, [name], about goals of care. She confirms that he would not want CPR, MV, ACLS. I will indicate DNR orders in the chart  Will transfer to floor bed and to [facility] as of 1/9   Levy Pupa, MD, PhD 01/05/2014, 10:20 AM [facility] [phone] or if no answer [phone]

## 2014-01-06 ENCOUNTER — Inpatient Hospital Stay (HOSPITAL_COMMUNITY): Payer: Medicare Other

## 2014-01-06 DIAGNOSIS — R1310 Dysphagia, unspecified: Secondary | ICD-10-CM | POA: Diagnosis present

## 2014-01-06 DIAGNOSIS — J189 Pneumonia, unspecified organism: Secondary | ICD-10-CM

## 2014-01-06 DIAGNOSIS — Z5189 Encounter for other specified aftercare: Secondary | ICD-10-CM

## 2014-01-06 LAB — CBC
HCT: 27.9 % — ABNORMAL LOW (ref 39.0–52.0)
Hemoglobin: 8.9 g/dL — ABNORMAL LOW (ref 13.0–17.0)
MCH: 34.1 pg — ABNORMAL HIGH (ref 26.0–34.0)
MCHC: 31.9 g/dL (ref 30.0–36.0)
MCV: 106.9 fL — ABNORMAL HIGH (ref 78.0–100.0)
Platelets: 234 10*3/uL (ref 150–400)
RBC: 2.61 MIL/uL — AB (ref 4.22–5.81)
RDW: 20 % — AB (ref 11.5–15.5)
WBC: 5.4 10*3/uL (ref 4.0–10.5)

## 2014-01-06 LAB — PROCALCITONIN: PROCALCITONIN: 0.74 ng/mL

## 2014-01-06 LAB — PROTIME-INR
INR: 5.18 (ref 0.00–1.49)
Prothrombin Time: 45.6 seconds — ABNORMAL HIGH (ref 11.6–15.2)

## 2014-01-06 LAB — BASIC METABOLIC PANEL
BUN: 57 mg/dL — ABNORMAL HIGH (ref 6–23)
CO2: 18 mEq/L — ABNORMAL LOW (ref 19–32)
CREATININE: 1.27 mg/dL (ref 0.50–1.35)
Calcium: 8.1 mg/dL — ABNORMAL LOW (ref 8.4–10.5)
Chloride: 98 mEq/L (ref 96–112)
GFR calc Af Amer: 64 mL/min — ABNORMAL LOW (ref >90)
GFR calc non Af Amer: 55 mL/min — ABNORMAL LOW (ref >90)
Glucose, Bld: 297 mg/dL — ABNORMAL HIGH (ref 70–99)
Potassium: 4.1 mEq/L (ref 3.7–5.3)
Sodium: 127 mEq/L — ABNORMAL LOW (ref 137–147)

## 2014-01-06 LAB — VANCOMYCIN, RANDOM: VANCOMYCIN RM: 29.8 ug/mL

## 2014-01-06 LAB — GLUCOSE, CAPILLARY: GLUCOSE-CAPILLARY: 72 mg/dL (ref 70–99)

## 2014-01-06 MED ORDER — INSULIN ASPART 100 UNIT/ML ~~LOC~~ SOLN: 0.0000 [IU] | SUBCUTANEOUS | Status: DC

## 2014-01-06 MED ORDER — CEFEPIME HCL 1 G IJ SOLR
1.0000 g | Freq: Two times a day (BID) | INTRAMUSCULAR | Status: DC
  Administered 2014-01-06: 1 g via INTRAVENOUS
  Filled 2014-01-06 (×2): qty 1

## 2014-01-06 MED ORDER — IBUPROFEN 400 MG PO TABS
400.0000 mg | ORAL_TABLET | Freq: Once | ORAL | Status: AC
Start: 2014-01-06 — End: 2014-01-06
  Administered 2014-01-06: 400 mg via ORAL
  Filled 2014-01-06: qty 1

## 2014-01-06 NOTE — Progress Notes (Signed)
Sister and 2 others in room visiting. Patient anxious with c/o sob. Respirations 32/minute and labored. Hob elevated higher and encouraged slow pursed-lip breathing. o2 per continuous pulse ox dropped to 82-84%. Respiratory called to reapply nonrebreather mask. Patient c/o feel smothery in mask and demanded off. o2 via nasal cannula at 4l/min. reapplied and o2 sat rose to 94% after patient repositioned and calmed.

## 2014-01-06 NOTE — Progress Notes (Signed)
Patient's sister requested to speak to or meet with Dr. Ladona Ridgel with Palliative Team. Sister left hospital to eat. Dr. Ladona Ridgel notified via telephone and sister's phone # [phone])  given to her.

## 2014-01-06 NOTE — Progress Notes (Signed)
ANTIBIOTIC CONSULT NOTE - FOLLOW UP  Pharmacy Consult for Vancomycin, Cefepime Indication: sepsis, HCAP  No Known Allergies  Patient Measurements: Height: 6' 3.5" (191.8 cm) Weight: 176 lb 9.4 oz (80.1 kg) IBW/kg (Calculated) : 85.65  Labs:  Recent Labs  01/04/14 0335 01/05/14 0350 01/06/14 0609  WBC 4.3 5.1 PENDING  HGB 9.4* 9.0* 8.9*  PLT PLATELET CLUMPS NOTED ON SMEAR, COUNT APPEARS ADEQUATE 235 234  CREATININE 1.07 1.17 1.27   Estimated Creatinine Clearance: 60.4 ml/min (by C-G formula based on Cr of 1.27).  Recent Labs  01/05/14 1157 01/06/14 0609  VANCOTROUGH 32.1*  --   VANCORANDOM  --  29.8      Assessment: 72 yoM discharged from [facility] 12/20 after treatment for ABLA 2/2 epistaxis in the setting of supratherapeutic INR. Pt returned 1/4 with c/o hypotension. In the ED, found with BP 82/58, hyperkalemia, INR < 10, lactate > 5, CXR cannot rule out PNA. Treating for HCAP with Vanc/Cefepime per Rx dosing. Appears to have aspirate water in the ER per MD notes.   1/5 >> Vanc >> 1/5 >> Cefepime >>   Tmax: afeb WBCs: wnl Renal: SCr cont to rise, 60CG, 54N PCT improving 1.69 ->> 1 ->> 0.74  1/5 strep pneumo ag >> neg 1/5 legionella ag >> neg 1/5 mrsa pcr >> positive 1/5 urine >> NGF 1/5 blood x 2 >> NGTD  Dose changes/drug level info:  1/8: VT 32. Doses held. 1/9: VR 30. Half-life 173hrs! Cont to hold. Decrease cefepime from q8h to q12h.  Goal of Therapy:  Vancomycin trough level 15-20 mcg/ml  Plan:   Cont to hold Vancomycin.   Decrease Cefepime to 1g IV q12h.  F/u daily.  Charolotte Eke, PharmD, pager [phone]. 01/06/2014,7:42 AM.

## 2014-01-06 NOTE — Progress Notes (Signed)
Spoke with Brett Canales Minor via telephone and gave update on patient. Will come see patient. O2 sat now 100% with nonrebreather mask on. Patient asleep with easy arousal.  Voiced he feels much better. Respirations 24 and easier.

## 2014-01-06 NOTE — Progress Notes (Addendum)
[facility] PROGRESS NOTE    Chase [name] ZOX:[id number] DOB: 05-29-1941 DOA: 01/20/2014 PCP: Delorse LekBURNETT,BRENT A, MD  HPI/Brief narrative 73 year old male with h/o PE, Chronic systolic CHF, severe cardiomyopathy with LVEF 15%severe protein calorie malnutrition, coagulopathy, thrombocytopenia, recently hospitalized for epistaxis & ABLA, returned on 01/02/14 with epistaxis, coagulopathy 2/2 to Coumadin and general FTT. He was hypotensive, BNP > 33K, K>6.9, metabolic acidosis and CXR concerning for PNA. He was admitted by PCCM to ICU and care was transferred to the Hospitalists on 1/9.    Assessment/Plan:  Possible HCAP Left lower lobe Vs Aspiration PNA/Hypoxia - Initially treated with IV Vancomycin & Cefepime. Vancomycin DC'ed. Completed 7 days of Cefepime and DC.  Dysphagia/recurrent Aspiration - Failed MBS 1/9. NPO until GOC meeting with Palliative team.  Recurrent Epistaxis - in the context of Coagulopathy. Cauterized by ENT previous admission. If re bleeds, will consult ENT again. - Resolved for now.  History of PE/Pulmonary HTN - CT chest 1/6 shows no change in PE size. ? IVC filter given apparent difficulty/intolerance to Coumadin. Await GOC meeting with palliative care.  Hypotension/Systolic CHF and CM EF 15%/Secondary PAH - No diuresis until BP allows. - Continue IV Digoxin  Hyperkalemia/Metabolic Acidosis - Resolved after Kayexalate. K supplements stopped.  Coagulopathy - Last PO Vitamin K on 1/5. INR has gradually decreased >10>9.04>6.89>5.64>5.18. Allow to gradually drift down to < 3 unless bleeds, in which case IV Vitamin K (NPO due to dysphagia). Start Lovenox when INR < 3. 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 No evidence of inhibitor to coagulation. Vitamin K replacement and start once daily Lovenox when INR < 3  FTT - Suspect largely related to Cardiomyopathy. - Due to profound weight loss and  debility, occult malignancy considered. CT scan chest, abd, pelvis to look for occult CA >> 1-6 NEGATIVE  Left foot hypoperfusion - Arterial doppler negative  Macrocytic Anemia - Stable  Hyponatremia - Partly due to hyperglycemia and dilutional - SSI.Marland Kitchen. Trend BMP  Hyperglycemia - Check A1C. SSI   Code Status: DNR Family Communication: Discussed with sister via phone Disposition Plan: To be determined   Consultants:  PCCM  Hematology  Palliative Care Medicine  Procedures:  None  Antibiotics:  IV Vancomycin DCed  IV Cefepime  Subjective: Dyspnea after an episode of chocking on water this morning.  Objective: Filed Vitals:   01/05/14 1200 01/05/14 2157 01/06/14 0528 01/06/14 0740  BP:  96/62 89/67 100/68  Pulse:  120 105 97  Temp: 97.6 F (36.4 C) 98.9 F (37.2 C) 97.3 F (36.3 C)   TempSrc: Axillary Axillary Oral   Resp:  16 16 28   Height:      Weight:      SpO2:  100% 98% 84%    Intake/Output Summary (Last 24 hours) at 01/06/14 0749 Last data filed at 01/06/14 0600  Gross per 24 hour  Intake    400 ml  Output    825 ml  Net   -425 ml   Filed Weights   01/03/14 0356 01/04/14 0300 01/05/14 0413  Weight: 76.4 kg (168 lb 6.9 oz) 76.7 kg (169 lb 1.5 oz) 80.1 kg (176 lb 9.4 oz)     Exam:  General: Frail cachetic male in mild respiratory distress this morning Neuro: Appears intact somewhat confused  HEENT: No JVD, LAN  Cardiovascular: HSR  Lungs: Bibasilar crackles 60% NRB  Abdomen: +bs, ecchymosis on llq abd  Musculoskeletal: Intact, muscle  wasting  Skin: Warm. Left foot no pedal pulse. ++ le edema l>r   Data Reviewed: Basic Metabolic Panel:  Recent Labs Lab 01/02/14 0955 01/02/14 2215 01/03/14 0415 01/04/14 0335 01/05/14 0350 01/06/14 0609  NA  --  137 136* 136* 133* 127*  K  --  5.2 5.0 4.4 4.1 4.1  CL  --  103 102 101 99 98  CO2  --  22 20 20 21  18*  GLUCOSE  --  86 81 114* 113* 297*  BUN  --  57* 55* 54* 55* 57*   CREATININE  --  1.00 0.97 1.07 1.17 1.27  CALCIUM  --  8.4 8.4 8.3* 8.3* 8.1*  MG 2.3  --   --   --   --   --   PHOS 4.5  --   --   --   --   --    Liver Function Tests:  Recent Labs Lab 01/02/14 0035  AST 28  ALT 18  ALKPHOS 142*  BILITOT 2.5*  PROT 5.6*  ALBUMIN 2.6*   No results found for this basename: LIPASE, AMYLASE,  in the last 168 hours No results found for this basename: AMMONIA,  in the last 168 hours CBC:  Recent Labs Lab 01/02/14 0035  01/02/14 0829 01/03/14 0415 01/04/14 0335 01/05/14 0350 01/06/14 0609  WBC 5.1  --   --  6.0 4.3 5.1 PENDING  NEUTROABS 3.8  --   --   --   --   --   --   HGB 8.9*  < > 9.9* 9.7* 9.4* 9.0* 8.9*  HCT 27.0*  < > 29.0* 30.1* 29.3* 29.1* 27.9*  MCV 105.1*  --   --  106.7* 108.1* 109.0* 106.9*  PLT 241  --   --  227 PLATELET CLUMPS NOTED ON SMEAR, COUNT APPEARS ADEQUATE 235 234  < > = values in this interval not displayed. Cardiac Enzymes: No results found for this basename: CKTOTAL, CKMB, CKMBINDEX, TROPONINI,  in the last 168 hours BNP (last 3 results)  Recent Labs  11/23/13 1514 12/13/13 0926  PROBNP 37244.0* 33876.0*   CBG:  Recent Labs Lab 01/02/14 1950  GLUCAP 90    Recent Results (from the past 240 hour(s))  CULTURE, BLOOD (ROUTINE X 2)     Status: None   Collection Time    01/02/14 12:35 AM      Result Value Range Status   Specimen Description BLOOD LEFT ARM   Final   Special Requests BOTTLES DRAWN AEROBIC AND ANAEROBIC 5CC   Final   Culture  Setup Time     Final   Value: 01/02/2014 09:02     Performed at [facility]   Culture     Final   Value:        BLOOD CULTURE RECEIVED NO GROWTH TO DATE CULTURE WILL BE HELD FOR 5 DAYS BEFORE ISSUING A FINAL NEGATIVE REPORT     Performed at [facility]   Report Status PENDING   Incomplete  CULTURE, BLOOD (ROUTINE X 2)     Status: None   Collection Time    01/02/14 12:50 AM      Result Value Range Status   Specimen Description BLOOD LEFT  HAND   Final   Special Requests BOTTLES DRAWN AEROBIC ONLY 3CC   Final   Culture  Setup Time     Final   Value: 01/02/2014 09:02     Performed at [facility]  Final   Value:        BLOOD CULTURE RECEIVED NO GROWTH TO DATE CULTURE WILL BE HELD FOR 5 DAYS BEFORE ISSUING A FINAL NEGATIVE REPORT     Performed at [facility]   Report Status PENDING   Incomplete  URINE CULTURE     Status: None   Collection Time    01/02/14  3:08 AM      Result Value Range Status   Specimen Description URINE, CLEAN CATCH   Final   Special Requests A   Final   Culture  Setup Time     Final   Value: 01/02/2014 09:03     Performed at [facility] Count     Final   Value: NO GROWTH     Performed at [facility]   Culture     Final   Value: NO GROWTH     Performed at [facility]   Report Status 01/03/2014 FINAL   Final  MRSA PCR SCREENING     Status: Abnormal   Collection Time    01/02/14 11:44 AM      Result Value Range Status   MRSA by PCR POSITIVE (*) NEGATIVE Final   Comment:            The GeneXpert MRSA Assay (FDA     approved for NASAL specimens     only), is one component of a     comprehensive MRSA colonization     surveillance program. It is not     intended to diagnose MRSA     infection nor to guide or     monitor treatment for     MRSA infections.     RESULT CALLED TO, READ BACK BY AND VERIFIED WITH:     A ASHLEY RN [id number] 01/02/14 A NAVARRO      Studies: No results found.      Scheduled Meds: . antiseptic oral rinse  15 mL Mouth Rinse q12n4p  . ceFEPime (MAXIPIME) IV  1 g Intravenous Q12H  . chlorhexidine  15 mL Mouth Rinse BID  . Chlorhexidine Gluconate Cloth  6 each Topical Q0600  . digoxin  0.063 mg Intravenous Daily  . feeding supplement (RESOURCE BREEZE)  1 Container Oral TID BM  . mupirocin ointment  1 application Nasal BID  . pantoprazole (PROTONIX) IV  40 mg Intravenous QHS   Continuous  Infusions: . sodium chloride 50 mL/hr at 01/06/14 0600    Active Problems:   Pulmonary embolus   CHF (congestive heart failure)   Protein-calorie malnutrition, severe   Hypotension, unspecified   Elevated INR   Epistaxis   Loss of weight   Warfarin-induced coagulopathy    Time spent: 50 minutes    Chase Fargnoli, MD, FACP, FHM. [facility] Pager [phone]  If 7PM-7AM, please contact night-coverage [email or web address] Password [email or web address] 01/06/2014, 7:49 AM    LOS: 5 days

## 2014-01-06 NOTE — Progress Notes (Signed)
Dr. Bennie Pierini in and assessed patient.

## 2014-01-06 NOTE — Progress Notes (Signed)
Name: Chase [name] MRN: [id number] DOB: 1941/02/27    ADMISSION DATE:  01/17/2014   REFERRING MD : EDP PRIMARY SERVICE: PCCM  CHIEF COMPLAINT:  Abd pain, epistaxis, FTT  BRIEF PATIENT DESCRIPTION:  73 yo just discharged (12-17-13) with similar complaints of epistaxis , elevated INR (on Coumadin for PE) and general FTT. Hypotensive, BNP >33K, K+ >6.9 and with metabolic acidosis. PCCM called to admit and due to acute on chronic medical problems we will admit to ICU. Chest X ray is concerning for Pna and he appears to aspirate water in ER.  SIGNIFICANT EVENTS / STUDIES:  swallow eval 1/5 >>no overt aspiration CT C/A/P 1/6 >> B effusions, stable PE clot burden, CM, diffuse body wall edema, ascites, no malignancy noted L LE arterial doppler 1/7 >> no evidence significant obstruction 1-8 transfer to floor and to triad service for 1-9 1-9 episode of choking while drinking water. LINES / TUBES:  CULTURES: 1-5 bc x 2>> 1-2 UC>>neg  ANTIBIOTICS: 1-5 vanc>>off 1-2 cefepime>>  HISTORY OF PRESENT ILLNESS:  73 yo just discharged with similar complaints of epistaxis , elevated INR (on Coumadin for PE) and general FTT. Hypotensive, BNP >33K, K+ >6.9 and with metabolic acidosis. PCCM called to admit and due to acute on chronic medical problems we will admit to ICU. Chest X ray is concerning for Pna and he appears to aspirate water in ER.  SUBJECTIVE:  Lost IV access 1/7 and pt didn't want replaced Poor PO Very weak  Currently on 60% NRB  VITAL SIGNS: Temp:  [97.3 F (36.3 C)-98.9 F (37.2 C)] 97.3 F (36.3 C) (01/09 0528) Pulse Rate:  [92-120] 92 (01/09 0815) Resp:  [16-28] 20 (01/09 0815) BP: (86-108)/(62-78) 108/78 mmHg (01/09 0815) SpO2:  [84 %-100 %] 100 % (01/09 0815) HEMODYNAMICS:   VENTILATOR SETTINGS:   INTAKE / OUTPUT: Intake/Output     01/08 0701 - 01/09 0700 01/09 0701 - 01/10 0700   P.O. 0    I.V. (mL/kg) 400 (5)    IV Piggyback     Total Intake(mL/kg) 400 (5)     Urine (mL/kg/hr) 825 (0.4)    Total Output 825     Net -425            PHYSICAL EXAMINATION: General:  Frail cachetic male Neuro:  Appears intact somewhat confused HEENT:  No JVD, LAN Cardiovascular:  HSR Lungs:  Bibasilar crackles 60% NRB Abdomen:  +bs, ecchymosis on llq abd  Musculoskeletal:  Intact, muscle wasting Skin:  Warm. Left foot no pedal pulse. ++ le edema l>r. INR>5  LABS:  CBC  Recent Labs Lab 01/04/14 0335 01/05/14 0350 01/06/14 0609  WBC 4.3 5.1 5.4  HGB 9.4* 9.0* 8.9*  HCT 29.3* 29.1* 27.9*  PLT PLATELET CLUMPS NOTED ON SMEAR, COUNT APPEARS ADEQUATE 235 234   Coag's  Recent Labs Lab 01/02/14 0955 01/02/14 1852 01/04/14 0335 01/06/14 0609  APTT 66*  --   --   --   INR 9.04* 6.89* 5.64* 5.18*   BMET  Recent Labs Lab 01/04/14 0335 01/05/14 0350 01/06/14 0609  NA 136* 133* 127*  K 4.4 4.1 4.1  CL 101 99 98  CO2 20 21 18*  BUN 54* 55* 57*  CREATININE 1.07 1.17 1.27  GLUCOSE 114* 113* 297*   Electrolytes  Recent Labs Lab 01/02/14 0955  01/04/14 0335 01/05/14 0350 01/06/14 0609  CALCIUM  --   < > 8.3* 8.3* 8.1*  MG 2.3  --   --   --   --  PHOS 4.5  --   --   --   --   < > = values in this interval not displayed. Sepsis Markers  Recent Labs Lab 01/02/14 0104 01/02/14 0955 01/05/14 0350 01/06/14 0609  LATICACIDVEN 5.64*  --   --   --   PROCALCITON  --  1.68 1.00 0.74   ABG  Recent Labs Lab 01/02/14 0013 01/02/14 1455  PHART 7.330* 7.357  PCO2ART 31.2* 38.4  PO2ART 148.0* 98.4   Liver Enzymes  Recent Labs Lab 01/02/14 0035  AST 28  ALT 18  ALKPHOS 142*  BILITOT 2.5*  ALBUMIN 2.6*   Cardiac Enzymes No results found for this basename: TROPONINI, PROBNP,  in the last 168 hours Glucose  Recent Labs Lab 01/02/14 1950  GLUCAP 90    Imaging Dg Chest Port 1 View  01/06/2014   CLINICAL DATA:  Dyspnea, history of aspiration, history of pulmonary embolism  EXAM: PORTABLE CHEST - 1 VIEW  COMPARISON:  CT scan  chest of 03 January 2014 and portable chest x-ray of 02 January 2014.  FINDINGS: The lung volumes are low. There is a moderate-sized pleural effusion on the left and a smaller effusion on the right. The cardiopericardial silhouette is enlarged. The pulmonary vascularity is indistinct but not clearly engorged.  IMPRESSION: Allowing for differences in inflation, there has not been dramatic interval change since the study of January 02, 2014. Bilateral pleural effusions are present. Certainly bibasilar atelectasis or pneumonia could be present and obscured by the soft tissue density at the lung bases.   Electronically Signed   By: David  [place]   On: 01/06/2014 08:44     ASSESSMENT / PLAN:  PULMONARY A:  Hx PE, Note CT chest 1/6 shows no change PE size Pulm HTN Possible LLL infiltrate/HCAP P:   O2 as needed Treat suspected pna as below Epistaxis > reversed coagulopathy and defer anticoag for now; ? Whether he will require an IVC filter given apparent difficulty/intolerance w coumadin Will have ENT eval for packing if bleeding restarts  CARDIOVASCULAR A: Hypotension Systolic CHF and CM EF 15% 12-14, troponins negative Secondary PAH L foot hypoperfusion, arterial doppler negative P:  No diuresis till BP will allow Careful with volume replacement Added back digoxin 1/8  RENAL  Recent Labs Lab 01/04/14 0335 01/05/14 0350 01/06/14 0609  K 4.4 4.1 4.1    Lab Results  Component Value Date   CREATININE 1.27 01/06/2014   CREATININE 1.17 01/05/2014   CREATININE 1.07 01/04/2014    Recent Labs Lab 01/04/14 0335 01/05/14 0350 01/06/14 0609  K 4.4 4.1 4.1   A:  Hyperkalemia (on bid [facility] at [facility]      Metabolic acidosis with resp compensation P:   Stop K dur Kayexalate given in ED at time of admission Follow K+   GASTROINTESTINAL A:  ? Aspiration, malnourishment  P:   Swallow eval 1/6, no overt aspiration but clinically VERY suspicious  PPI Ct ABD nad  1-6  HEMATOLOGIC Lab Results  Component Value Date   INR 5.18* 01/06/2014   INR 5.64* 01/04/2014   INR 6.89* 01/02/2014   PROTIME 60.7* 01/03/2014    A:  Elevated INR secondary to coumadin; Interestingly per Dr Arlyce Dice at [facility], pt received Vit K 5mg  on 1/2 for INR > 7, yet his INR continued to rise.  P:  Vit K given Repeat INR on 1/9 Restart anticoagulation once INR < 2.0 Appreciate DR Granfiortuna's help, recommends an alternative anticoag to coumadin to avoid supertherapeutic  status and bleeding risk  INFECTIOUS A:  Presumed LLL HCAP P:   Empiric vanco + cefepime, finish a 7 day course and then d/c Trend procalcitonin    ENDOCRINE A:  No acute issue, cortisol normal P:    NEUROLOGIC A:  No acute issue P:    FAILURE TO THRIVE A: Suspect largely related to cardiomyopathy, but degree of wt loss and debilitation seems profound. Consider occult malignancy P: -  CT scan chest, abd, pelvis to look for occult CA >> 1-6 NEGATIVE.  GOALS: Dr Delton CoombesByrum spoke with his sister, Jasmine DecemberSharon, about goals of care. She confirms that he would not want CPR, MV, ACLS. DNR orders in the chart   1/9 episode of desaturation. He can not drink without choking. Palliative  care is to revisit, there is little for critical care to offer. He made need swallow re-eval, more modified diet, thickener.    Levy Pupaobert Cattleya Dobratz, MD, PhD 01/06/2014, 9:08 AM [facility] [phone] or if no answer [phone]
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
## 2014-01-06 NOTE — Progress Notes (Signed)
Patient c/o sob with labored breathing noted. Relayed he drank some water and it went down his windpipe. Lung fields diminished without evidence of noticeable congestion. 02 sats running in the low 80's with o2 via nasal cannula at 4L. Respiratory therapist called and came in and applied nonrebreather mask. Dr. Bennie Pierini notified via phone and relayed to call Brett Canales Minor in ICU as familiar with patient. Dr. Bennie Pierini will come by to see patient soon.

## 2014-01-07 DIAGNOSIS — J189 Pneumonia, unspecified organism: Secondary | ICD-10-CM

## 2014-01-07 DIAGNOSIS — I959 Hypotension, unspecified: Secondary | ICD-10-CM

## 2014-01-07 DIAGNOSIS — R1310 Dysphagia, unspecified: Secondary | ICD-10-CM

## 2014-01-07 DIAGNOSIS — I5022 Chronic systolic (congestive) heart failure: Secondary | ICD-10-CM

## 2014-01-07 LAB — COMPREHENSIVE METABOLIC PANEL
ALT: 25 U/L (ref 0–53)
AST: 49 U/L — ABNORMAL HIGH (ref 0–37)
Albumin: 2.4 g/dL — ABNORMAL LOW (ref 3.5–5.2)
Alkaline Phosphatase: 104 U/L (ref 39–117)
BUN: 66 mg/dL — ABNORMAL HIGH (ref 6–23)
CO2: 17 mEq/L — ABNORMAL LOW (ref 19–32)
CREATININE: 1.82 mg/dL — AB (ref 0.50–1.35)
Calcium: 8.7 mg/dL (ref 8.4–10.5)
Chloride: 99 mEq/L (ref 96–112)
GFR calc non Af Amer: 36 mL/min — ABNORMAL LOW (ref >90)
GFR, EST AFRICAN AMERICAN: 41 mL/min — AB (ref >90)
Glucose, Bld: 66 mg/dL — ABNORMAL LOW (ref 70–99)
Potassium: 4.9 mEq/L (ref 3.7–5.3)
SODIUM: 135 meq/L — AB (ref 137–147)
TOTAL PROTEIN: 5.5 g/dL — AB (ref 6.0–8.3)
Total Bilirubin: 5 mg/dL — ABNORMAL HIGH (ref 0.3–1.2)

## 2014-01-07 LAB — HEMOGLOBIN A1C
Hgb A1c MFr Bld: 6.2 % — ABNORMAL HIGH (ref <5.7)
MEAN PLASMA GLUCOSE: 131 mg/dL — AB (ref <117)

## 2014-01-07 LAB — CBC
HEMATOCRIT: 31.2 % — AB (ref 39.0–52.0)
Hemoglobin: 9.6 g/dL — ABNORMAL LOW (ref 13.0–17.0)
MCH: 34.3 pg — ABNORMAL HIGH (ref 26.0–34.0)
MCHC: 30.8 g/dL (ref 30.0–36.0)
MCV: 111.4 fL — ABNORMAL HIGH (ref 78.0–100.0)
Platelets: 255 10*3/uL (ref 150–400)
RBC: 2.8 MIL/uL — AB (ref 4.22–5.81)
RDW: 20.3 % — ABNORMAL HIGH (ref 11.5–15.5)
WBC: 7.2 10*3/uL (ref 4.0–10.5)

## 2014-01-07 LAB — GLUCOSE, CAPILLARY
GLUCOSE-CAPILLARY: 103 mg/dL — AB (ref 70–99)
GLUCOSE-CAPILLARY: 64 mg/dL — AB (ref 70–99)
GLUCOSE-CAPILLARY: 75 mg/dL (ref 70–99)
Glucose-Capillary: 78 mg/dL (ref 70–99)

## 2014-01-07 LAB — PROTIME-INR
INR: 5.43 — AB (ref 0.00–1.49)
Prothrombin Time: 47.3 seconds — ABNORMAL HIGH (ref 11.6–15.2)

## 2014-01-07 MED ORDER — LORAZEPAM 2 MG/ML IJ SOLN
0.2500 mg | Freq: Four times a day (QID) | INTRAMUSCULAR | Status: DC | PRN
  Administered 2014-01-07: 0.25 mg via INTRAVENOUS
  Filled 2014-01-07: qty 1

## 2014-01-07 MED ORDER — DEXTROSE 5 % IV SOLN
1.0000 g | INTRAVENOUS | Status: DC
  Administered 2014-01-07: 1 g via INTRAVENOUS
  Filled 2014-01-07 (×2): qty 1

## 2014-01-07 MED ORDER — DEXTROSE 50 % IV SOLN
25.0000 mL | Freq: Once | INTRAVENOUS | Status: AC | PRN
  Administered 2014-01-07: 25 mL via INTRAVENOUS

## 2014-01-07 MED ORDER — FUROSEMIDE 10 MG/ML IJ SOLN
20.0000 mg | Freq: Once | INTRAMUSCULAR | Status: AC
  Administered 2014-01-07: 20 mg via INTRAVENOUS
  Filled 2014-01-07: qty 2

## 2014-01-07 MED ORDER — MORPHINE SULFATE 2 MG/ML IJ SOLN
1.0000 mg | INTRAMUSCULAR | Status: DC | PRN
  Administered 2014-01-07 (×2): 1 mg via INTRAVENOUS
  Filled 2014-01-07 (×2): qty 1

## 2014-01-07 MED ORDER — DEXTROSE 50 % IV SOLN
INTRAVENOUS | Status: AC
  Filled 2014-01-07: qty 50

## 2014-01-07 MED ORDER — LORAZEPAM 2 MG/ML IJ SOLN
0.5000 mg | INTRAMUSCULAR | Status: DC | PRN
  Administered 2014-01-07: 0.5 mg via INTRAVENOUS
  Filled 2014-01-07: qty 1

## 2014-01-07 NOTE — Progress Notes (Signed)
CRITICAL VALUE ALERT  Critical value received:  0600 Date of notification:  01/07/13  Time of notification:  0600  Critical value read back:yes  Nurse who received alert: Lorrin Mais    MD notified (1st page):  Judie Petit [facility]  Time of first page:  0600  MD notified (2nd page):  Time of second page:  Responding MD:  Daphane Shepherd  Time MD responded:0600
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
## 2014-01-07 NOTE — Progress Notes (Signed)
Patient has weeping from right upper arm - clear fluid,mottling to bilateral lower extremities,low urine output,V/S T 97.6.,P 93, R 31 ,  B/P 103/62 , O2 SAT 100 % on 5 liters. [facility]  Notified and order received .

## 2014-01-07 NOTE — Progress Notes (Signed)
Paged in response to Chase [name]'s condition. Chaplain was gloved and gowned as posted instructions but family were not.   Chase [name] was reared an observant Jew and holds to this tradition in a loose connectiveness. He has of late attended a [employer] with his friends, but his sister knows nothing of this faith community other than that they meet close to Chase [name]'s home. He is said to be very spiritual, but, to family's knowledge he was happy with his current faith connections. He was very close to his siblings, but not so close as to tell them that he had stopped eating and was not feeling well. He is reported to be very intelligent and resourceful, likely he tried to figure out a way out of his distress rather than tell anyone about his situation.   His sister Chase [name], his surrogate, expressed upset at being told that there was nothing that could be done for her brother, especially not connecting a feeding tube when food may have kept him conscience a little longer. She now feels that it is too late for any heroic measures and she is resigned to her brother dying. There was also questions about medicine that was not given yesterday (9 Jan) and if that medicine would have made a difference. These questions were referred to the nurse to confirm and document further.  At present the family is satisfied that all that can be done for Chase [name] is being done by the staff. DNR confirmed from a spiritual standpoint. Comfort of Chase [name] is the family's primary concern.  Conference with nurse which alerted her to the above.  Chase [name] responded weakly to chaplain's speaking to him, squeezing the chaplains hand three time when asked questions, and turning his head toward the chaplain when the chaplain spoke. He seemed anxious about his inability to breathe properly, but did not respond to suggestions by his sister or the chaplain to breath with less panic.   Family wishes, if possible, the [facility] Jewish Chaplain visit the family, rather than a local Rabbi. A note will be sent to this chaplain to see if a visit can be arranged.  Spiritual Plan of Care:   Responsive Chaplain visits as requested by family. Routine visits during Chaplain rounding If Chase [name] survives until Monday, a possible visit by a Jewish Chaplain. Chaplain being paged when/if Chase [name] dies to comfort the family and assist with religious rites.  Chase [name]. Chase [name], DMin Chaplain

## 2014-01-07 NOTE — Significant Event (Signed)
Hypoglycemic Event  CBG: 64  Treatment: D50 IV 25 mL  Symptoms: None  Follow-up CBG: Time: 0600 CBG Result 103 Possible Reasons for Event: Unknown  Comments/MD notified: M Lynch    Chase [name], Georgeanne Nim  Remember to initiate Hypoglycemia Order Set & complete

## 2014-01-07 NOTE — Progress Notes (Signed)
T/c from Dr. Lovena Le stating that it's likely that Pt will experience a hospital death and asked that CSW meet with sister at bedside to discuss [facility]'s role, given this prognosis.  Met with sister and spoke at length re: Pt's prognosis.  Sister has come to terms with the idea that Pt may experience a hospital death and understands that [facility], at this time, is not likely an option.  Sister very emotional and has many questions re: Pt's course of care.  CSW provided emotional support and encouraged sister to discuss with Pt's RN her concerns and questions.  Sister voiced that she would.  CSW thanked Pt's sister for her time.  Bernita Raisin, [place] [employer] [phone]

## 2014-01-07 NOTE — Progress Notes (Signed)
ANTIBIOTIC CONSULT NOTE - FOLLOW UP  Pharmacy Consult for Cefepime Indication: sepsis, HCAP  No Known Allergies  Patient Measurements: Height: 6' 3.5" (191.8 cm) Weight: 176 lb 9.4 oz (80.1 kg) IBW/kg (Calculated) : 85.65  Labs:  Recent Labs  01/05/14 0350 01/06/14 0609 01/07/14 0455  WBC 5.1 5.4 7.2  HGB 9.0* 8.9* 9.6*  PLT 235 234 255  CREATININE 1.17 1.27 1.82*   Estimated Creatinine Clearance: 42.2 ml/min (by C-G formula based on Cr of 1.82).  Recent Labs  01/05/14 1157 01/06/14 0609  VANCOTROUGH 32.1*  --   VANCORANDOM  --  29.8      Assessment: 72 yoM discharged from [facility] 12/20 after treatment for ABLA 2/2 epistaxis in the setting of supratherapeutic INR. Pt returned 1/4 with c/o hypotension. In the ED, found with BP 82/58, hyperkalemia, INR < 10, lactate > 5, CXR cannot rule out PNA. Treating for HCAP with Vanc/Cefepime per Rx dosing. Appears to have aspirate water in the ER per MD notes.   1/5 >> Vanc >> 1/9 1/5 >> Cefepime >>   Tmax: afeb WBCs: wnl Renal: SCr continues to rise (1.82).  CrCl ~41 ml/min (CG) PCT improving 1.69 ->> 1 ->> 0.74  1/5 strep pneumo ag >> neg 1/5 legionella ag >> neg 1/5 mrsa pcr >> positive 1/5 urine >> NGF 1/5 blood x 2 >> NGTD  Dose changes/drug level info:  1/8: VT 32. Doses held. 1/9: VR 30. Estimated half-life 173hrs  Day #6/7 Cefepime 1g IV q12h for sepsis, HCAP.  Due to continued SCr increase, adjusting dose to q24h dosing.  Per MD, plan is to complete 7 days of Cefepime.  Note, last dose of vancomycin was 1/8 but will continue to remain in system for days, especially with increasing SCr.  Goal of Therapy:  Eradication of infection Doses adjusted per renal clearance  Plan:  Adjust Cefepime to 1g IV q24h.  Last dose will be tomorrow (1/11) per MD instruction for total 7 day treatment.  Clance Boll, PharmD, BCPS Pager: [phone] 01/07/2014 9:13 AM

## 2014-01-07 NOTE — Progress Notes (Signed)
Patient [id number] Chase [name]      DOB: 15-Jan-1941      TTS:[id number]  Goals of care this am at 8 am   Avriel Kandel L. Ladona Ridgel, MD MBA The Palliative Medicine Team at [facility] Phone: [phone] Pager: [phone]

## 2014-01-07 NOTE — Consult Note (Signed)
Patient [id number] Chase [name]      DOB: 11/13/1941      FRT:[id number]  Met with patient's sister who is Chase [name]'s acting surrogate.  Chase [name] has limited ability to interact at present .  He appears in mild respiratory distress, and continues to say "help me".    Chase [name] is struggling with the idea that Chase [name] is dying.  She had a range of responses that varied from admitting that he was declining and possibly declining to asking to have a feeding tube placed and use breast milk to boost his health.  We discussed that while those were options that he will likely not benefit overall and might not be a candidate as I believe that Chase [name] is dying.  When we returned to the room Chase [name] was in more distress.  tachypneaic and restless, stating help me. Chase [name] permitted me to add low dose morphine and ativan for comfort while continuing to treat for pneumonia etc.   Recommend:   1.  DNR  2.  Respiratory distress with likely impending death:  Morphine 1 mg q 2 hrs prn.  May need to progress on to morphine drip.  3.  Anxiety: prn ativan   4.  The patient does not adhere to specific religion and would likely not be comforted by chaplain support but I did offer support if his sister needed.  Discussed with Dr. Algis Liming.   Total time 800-915 am  Ellysa Parrack L. Lovena Le, MD MBA The Palliative Medicine Team at [facility] Phone: [phone] Pager: [phone]

## 2014-01-08 LAB — CULTURE, BLOOD (ROUTINE X 2)
CULTURE: NO GROWTH
Culture: NO GROWTH

## 2014-01-09 LAB — GLUCOSE, CAPILLARY: Glucose-Capillary: 87 mg/dL (ref 70–99)

## 2014-01-09 NOTE — Discharge Summary (Addendum)
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 APPEARS ADEQUATE 235 234 255  < > = values in this interval not displayed. Cardiac Enzymes: No results found for this basename: CKTOTAL, CKMB, CKMBINDEX, TROPONINI,  in the last 168 hours BNP: BNP (last 3 results)  Recent Labs  11/23/13 1514 12/13/13 0926  PROBNP 37244.0* 33876.0*   CBG:  Recent Labs Lab 01/06/14 2210 01/07/14 0030 01/07/14 0449 01/07/14 0604 01/07/14 0821  GLUCAP 72 75 64* 103* 78     Signed:  Vernell Leep, MD, FACP, FHM. [facility] Pager [phone]  If 7PM-7AM, please contact night-coverage [email or web address] Password [facility] 01/22/2014, 6:33 PM

## 2014-01-29 NOTE — Progress Notes (Signed)
Chaplain paged at [phone] and informed of Mr Averitt's death. Chaplain responded and arrived to be with his long time friend, also named Theron Arista. Sister [name] was at Mr Kalbaugh home asleep and decided not to return to see his body. She will call nurses later in the morning with [facility] information.   Prayers and Comfort provided to Mr Spray's friend. No chaplain follow up anticipated.  Benjie Karvonen. Evangelyne Loja, DMin Chaplain

## 2014-01-29 NOTE — Progress Notes (Addendum)
Pt found unresponsive with no respirations or pulse. Death verified by 2 RNs and MD notified. Sister was contacted. Sister did not know the name of the funeral home at this time but will call back later this morning with the information.

## 2014-01-29 NOTE — Progress Notes (Deleted)
Pt found unresponsive, no respirations, no pulse, pupils fixed and dilated.  Chase Mcgregor, NP, on call for [facility] notified.

## 2014-01-29 NOTE — Progress Notes (Signed)
Pt found unresponsive no respirations, no pulse, pupils are fixed and dilated.  Elray Mcgregor, NP, notified of pt's death.

## 2014-01-29 DEATH — deceased

## 2014-07-03 IMAGING — CR DG CHEST 1V PORT
1 series · 1 of 1 positions shown · non-contrast
Comparison: 11/29/2013

CLINICAL DATA: Shortness of breath, hemoptysis

EXAM:
PORTABLE CHEST - 1 VIEW

[AP]
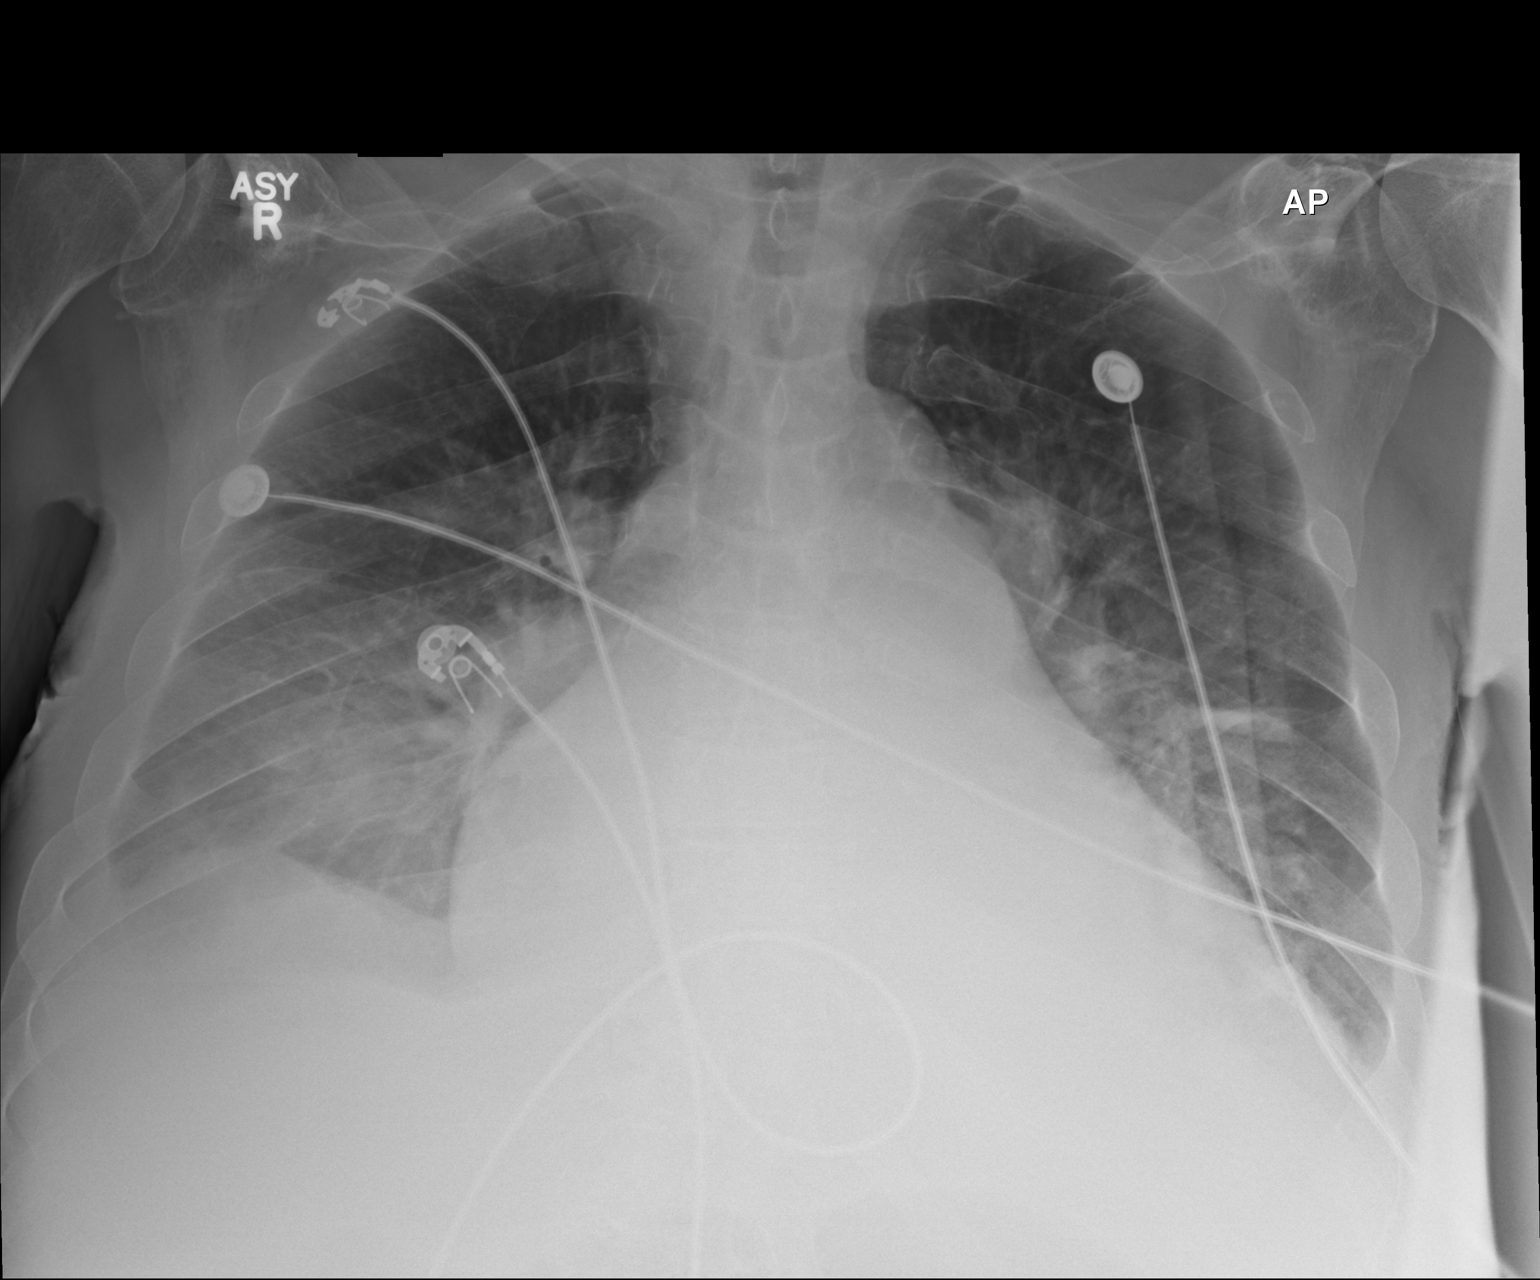

[1 of 1 positions shown; findings below may reference images not displayed]

FINDINGS: Cardiomegaly. Central mild vascular congestion and mild perihilar
interstitial prominence suspicious for mild interstitial edema.
There is right small pleural effusion with right lower lobe
atelectasis or infiltrate. Mild left basilar atelectasis or
infiltrate.
IMPRESSION: Central mild vascular congestion and mild perihilar interstitial
prominence suspicious for mild interstitial edema. There is right
small pleural effusion with right lower lobe atelectasis or
infiltrate. Mild left basilar atelectasis or infiltrate.

## 2014-07-23 IMAGING — CR DG CHEST 1V PORT
1 series · 1 of 1 positions shown · non-contrast
Comparison: 12/15/2013

CLINICAL DATA: Weakness and hyper it

EXAM:
PORTABLE CHEST - 1 VIEW

[AP]
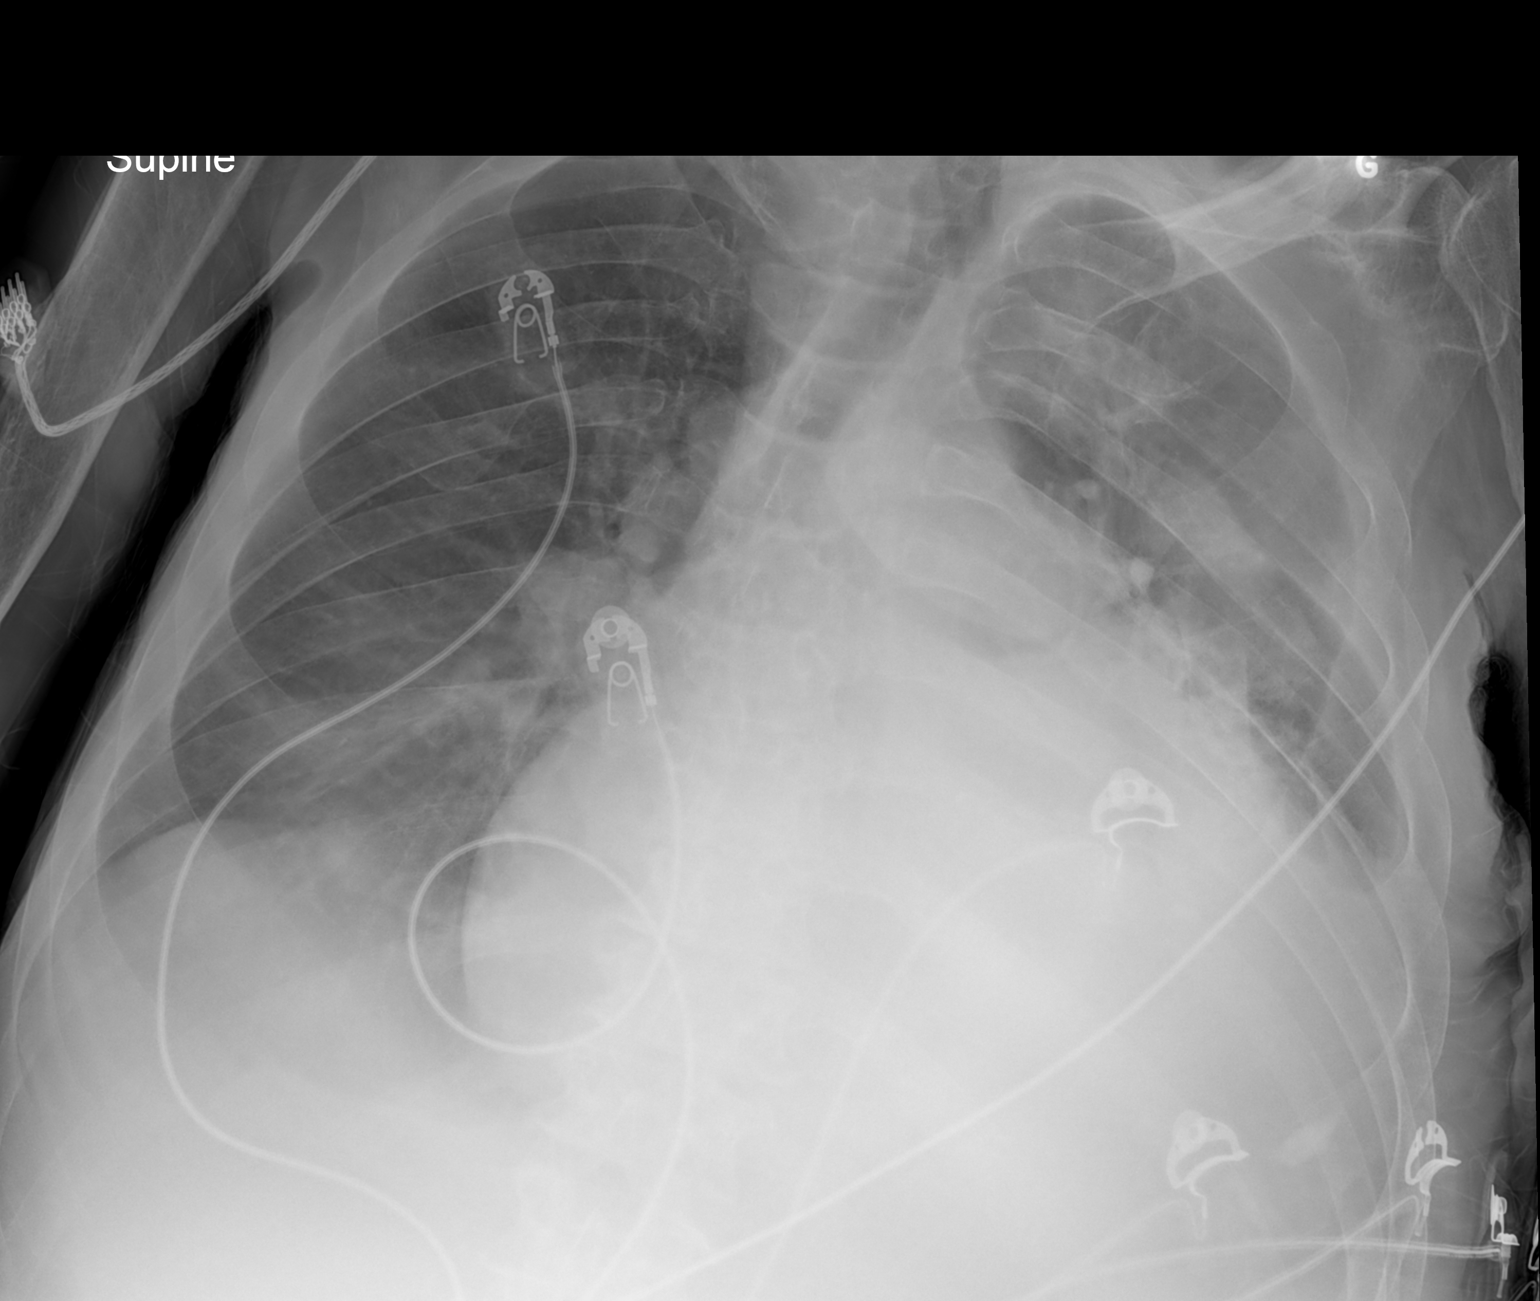

[1 of 1 positions shown; findings below may reference images not displayed]

FINDINGS: Cardiopericardial enlargement, similar to prior. Pericardial
effusion is not excluded, but was not been seen on November 23, 2013
CT. Decreasing or layering right pleural effusion. Persistent left
pleural effusion. No definitive edema.
IMPRESSION: 1. Chronic cardiomegaly and pleural effusions.  No definitive edema.
2. Pleural fluid could obscure underlying pneumonia, especially on
the left.
